# Patient Record
Sex: Male | Born: 1965
Health system: Southern US, Community
[De-identification: ages and names within clinical notes are randomized; demographics above are authoritative.]

## PROBLEM LIST (undated history)

## (undated) DIAGNOSIS — Z1331 Encounter for screening for depression: Secondary | ICD-10-CM

## (undated) DIAGNOSIS — R531 Weakness: Secondary | ICD-10-CM

## (undated) DIAGNOSIS — E785 Hyperlipidemia, unspecified: Secondary | ICD-10-CM

## (undated) DIAGNOSIS — Z6830 Body mass index (BMI) 30.0-30.9, adult: Secondary | ICD-10-CM

## (undated) DIAGNOSIS — I10 Essential (primary) hypertension: Secondary | ICD-10-CM

## (undated) HISTORY — PX: NO PAST SURGERIES: SHX2092

## (undated) HISTORY — DX: Encounter for screening for depression: Z13.31

## (undated) HISTORY — DX: Body mass index (BMI) 30.0-30.9, adult: Z68.30

## (undated) HISTORY — DX: Weakness: R53.1

## (undated) HISTORY — DX: Hyperlipidemia, unspecified: E78.5

## (undated) HISTORY — DX: Essential (primary) hypertension: I10

---

## 2018-07-28 DIAGNOSIS — H353121 Nonexudative age-related macular degeneration, left eye, early dry stage: Secondary | ICD-10-CM | POA: Diagnosis not present

## 2019-03-05 DIAGNOSIS — Z20828 Contact with and (suspected) exposure to other viral communicable diseases: Secondary | ICD-10-CM | POA: Diagnosis not present

## 2019-04-22 DIAGNOSIS — R05 Cough: Secondary | ICD-10-CM | POA: Diagnosis not present

## 2019-04-22 DIAGNOSIS — R509 Fever, unspecified: Secondary | ICD-10-CM | POA: Diagnosis not present

## 2019-04-22 DIAGNOSIS — R6883 Chills (without fever): Secondary | ICD-10-CM | POA: Diagnosis not present

## 2019-04-22 DIAGNOSIS — Z20828 Contact with and (suspected) exposure to other viral communicable diseases: Secondary | ICD-10-CM | POA: Diagnosis not present

## 2019-07-21 DIAGNOSIS — Z125 Encounter for screening for malignant neoplasm of prostate: Secondary | ICD-10-CM | POA: Diagnosis not present

## 2019-07-21 DIAGNOSIS — I10 Essential (primary) hypertension: Secondary | ICD-10-CM | POA: Diagnosis not present

## 2019-07-21 DIAGNOSIS — E785 Hyperlipidemia, unspecified: Secondary | ICD-10-CM | POA: Diagnosis not present

## 2019-07-21 DIAGNOSIS — Z1211 Encounter for screening for malignant neoplasm of colon: Secondary | ICD-10-CM | POA: Diagnosis not present

## 2019-08-26 DIAGNOSIS — Z1331 Encounter for screening for depression: Secondary | ICD-10-CM | POA: Diagnosis not present

## 2019-08-26 DIAGNOSIS — I10 Essential (primary) hypertension: Secondary | ICD-10-CM | POA: Diagnosis not present

## 2019-08-26 DIAGNOSIS — R55 Syncope and collapse: Secondary | ICD-10-CM | POA: Diagnosis not present

## 2019-08-26 DIAGNOSIS — E785 Hyperlipidemia, unspecified: Secondary | ICD-10-CM | POA: Diagnosis not present

## 2019-08-26 DIAGNOSIS — R531 Weakness: Secondary | ICD-10-CM | POA: Diagnosis not present

## 2019-08-26 DIAGNOSIS — E162 Hypoglycemia, unspecified: Secondary | ICD-10-CM | POA: Diagnosis not present

## 2019-09-01 ENCOUNTER — Other Ambulatory Visit: Payer: Self-pay

## 2019-09-01 ENCOUNTER — Encounter: Payer: Self-pay | Admitting: Cardiology

## 2019-09-01 ENCOUNTER — Ambulatory Visit (INDEPENDENT_AMBULATORY_CARE_PROVIDER_SITE_OTHER): Payer: BC Managed Care – PPO | Admitting: Cardiology

## 2019-09-01 DIAGNOSIS — I10 Essential (primary) hypertension: Secondary | ICD-10-CM

## 2019-09-01 DIAGNOSIS — E782 Mixed hyperlipidemia: Secondary | ICD-10-CM

## 2019-09-01 DIAGNOSIS — R011 Cardiac murmur, unspecified: Secondary | ICD-10-CM | POA: Diagnosis not present

## 2019-09-01 DIAGNOSIS — R0683 Snoring: Secondary | ICD-10-CM | POA: Diagnosis not present

## 2019-09-01 NOTE — Progress Notes (Signed)
Cardiology Office Note:    Date:  09/01/2019   ID:  Mark Mcclain, DOB 13-Jul-1965, MRN [id number]  PCP:  Marlyn Corporal, PA  Cardiologist:  Garwin Brothers, MD   Referring MD: Marlyn Corporal, PA    ASSESSMENT:    1. Essential hypertension   2. Mixed dyslipidemia   3. Cardiac murmur   4. Snoring    PLAN:    In order of problems listed above:  1. Primary prevention stressed with the patient.  Importance of compliance with diet and medication stressed and he vocalized understanding.  Again patient has no chest pain and I asked him to walk half an hour a day on a regular basis 2. Essential hypertension: His blood pressure is running low with the medications and I think this is causing him his hypotension.  He is having symptoms of orthostatic hypotension.  In addition the summer months might make this worse.  I have told him to stop his blood pressure medication.  His salt intake in the diet is very high and I educated him about this. 3. Mixed dyslipidemia: Diet was urged I reviewed his lipids with him at extensive length he will be back to see me in 6 weeks before which he will have complete blood work including lipids diet was heavily emphasized 4. For primary prevention in the stratification I suggested calcium scoring and he is agreeable. 5. Patient gives history of snoring.  His wife mentions to him that he sometimes stops breathing.  He would like to have a sleep study and we will order. 6. Further recommendations will be made based on the findings of the a forementioned test.   Medication Adjustments/Labs and Tests Ordered: Current medicines are reviewed at length with the patient today.  Concerns regarding medicines are outlined above.  Orders Placed This Encounter  Procedures  . CT CARDIAC SCORING  . Basic metabolic panel  . Hepatic function panel  . Lipid panel  . Ambulatory referral to Sleep Studies  . EKG 12-Lead  . ECHOCARDIOGRAM COMPLETE   No orders of the defined  types were placed in this encounter.    History of Present Illness:    Mark Mcclain is a 54 y.o. male who is being seen today for the evaluation of essential hypertension and mixed dyslipidemia at the request of Marlyn Corporal, [place].  Patient is a pleasant 54 year old male.  He has past medical history of essential hypertension and dyslipidemia.  He leads a sedentary lifestyle.  He denies any chest pain orthopnea or PND.  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  With this he has no chest pain.  No orthopnea or PND.  He mentions to me that when he is trying to change his posture he notices some lightheadedness.  This happens only on change of posture.  He is Dr. Has got down his blood pressure medicine but the symptoms have improved some but not completely.  At the time of my evaluation, the patient is alert awake oriented and in no distress.  No syncope or dizziness.  Past Medical History:  Diagnosis Date  . BMI 30.0-30.9,adult   . Hyperlipidemia   . Hypertension   . Screening for depression   . Weakness     Past Surgical History:  Procedure Laterality Date  . NO PAST SURGERIES      Current Medications: Current Meds  Medication Sig  . Cyanocobalamin (B-12) 2500 MCG TABS Take 2,500 mg by mouth daily.  Marland Kitchen  rosuvastatin (CRESTOR) 10 MG tablet Take 10 mg by mouth daily.  . Saccharomyces boulardii (PROBIOTIC) 250 MG CAPS Take 250 mg by mouth daily.  . [DISCONTINUED] losartan (COZAAR) 50 MG tablet Take 75 mg by mouth daily.     Allergies:   Zithromax [azithromycin]   Social History   Socioeconomic History  . Marital status: Unknown    Spouse name: Not on file  . Number of children: Not on file  . Years of education: Not on file  . Highest education level: Not on file  Occupational History  . Not on file  Tobacco Use  . Smoking status: Never Smoker  . Smokeless tobacco: Current User    Types: Chew  Substance and Sexual Activity  . Alcohol use: Not on file  .  Drug use: Not on file  . Sexual activity: Not on file  Other Topics Concern  . Not on file  Social History Narrative  . Not on file   Social Determinants of Health   Financial Resource Strain:   . Difficulty of Paying Living Expenses:   Food Insecurity:   . Worried About Charity fundraiser in the Last Year:   . Arboriculturist in the Last Year:   Transportation Needs:   . Film/video editor (Medical):   Marland Kitchen Lack of Transportation (Non-Medical):   Physical Activity:   . Days of Exercise per Week:   . Minutes of Exercise per Session:   Stress:   . Feeling of Stress :   Social Connections:   . Frequency of Communication with Friends and Family:   . Frequency of Social Gatherings with Friends and Family:   . Attends Religious Services:   . Active Member of Clubs or Organizations:   . Attends Archivist Meetings:   Marland Kitchen Marital Status:      Family History: The patient's family history is not on file.  ROS:   Please see the history of present illness.    All other systems reviewed and are negative.  EKGs/Labs/Other Studies Reviewed:    The following studies were reviewed today: EKG reveals sinus rhythm and nonspecific ST-T changes   Recent Labs: No results found for requested labs within last 8760 hours.  Recent Lipid Panel No results found for: CHOL, TRIG, HDL, CHOLHDL, VLDL, LDLCALC, LDLDIRECT  Physical Exam:    VS:  BP 126/82   Pulse (!) 52   Temp 97.6 F (36.4 C)   Ht 5\' 11"  (1.803 m)   Wt 215 lb 6.4 oz (97.7 kg)   SpO2 96%   BMI 30.04 kg/m     Wt Readings from Last 3 Encounters:  09/01/19 215 lb 6.4 oz (97.7 kg)     GEN: Patient is in no acute distress HEENT: Normal NECK: No JVD; No carotid bruits LYMPHATICS: No lymphadenopathy CARDIAC: S1 S2 regular, 2/6 systolic murmur at the apex. RESPIRATORY:  Clear to auscultation without rales, wheezing or rhonchi  ABDOMEN: Soft, non-tender, non-distended MUSCULOSKELETAL:  No edema; No deformity    SKIN: Warm and dry NEUROLOGIC:  Alert and oriented x 3 PSYCHIATRIC:  Normal affect    Signed, Jenean Lindau, MD  09/01/2019 11:43 AM    [place]

## 2019-09-01 NOTE — Patient Instructions (Signed)
Medication Instructions:  Your physician has recommended you make the following change in your medication:   Stop Losartan  *If you need a refill on your cardiac medications before your next appointment, please call your pharmacy*   Lab Work: Your physician recommends that you return for lab work in: before your next visit.  You need to have labs done when you are fasting.  You can come Monday through Friday 8:30 am to 12:00 pm and 1:15 to 4:30. You do not need to make an appointment as the order has already been placed. The labs you are going to have done are BMET,  LFT and Lipids.  If you have labs (blood work) drawn today and your tests are completely normal, you will receive your results only by: Marland Kitchen MyChart Message (if you have MyChart) OR . A paper copy in the mail If you have any lab test that is abnormal or we need to change your treatment, we will call you to review the results.   Testing/Procedures: Your physician has requested that you have an echocardiogram. Echocardiography is a painless test that uses sound waves to create images of your heart. It provides your doctor with information about the size and shape of your heart and how well your heart's chambers and valves are working. This procedure takes approximately one hour. There are no restrictions for this procedure.   We will order CT coronary calcium score $150  Please call [phone] to schedule   [facility]  [address], [place] [address]     Follow-Up: At [facility], you and your health needs are our priority.  As part of our continuing mission to provide you with exceptional heart care, we have created designated Provider Care Teams.  These Care Teams include your primary Cardiologist (physician) and Advanced Practice Providers (APPs -  Physician Assistants and Nurse Practitioners) who all work together to provide you with the care you need, when you need it.  We recommend  signing up for the patient portal called "MyChart".  Sign up information is provided on this After Visit Summary.  MyChart is used to connect with patients for Virtual Visits (Telemedicine).  Patients are able to view lab/test results, encounter notes, upcoming appointments, etc.  Non-urgent messages can be sent to your provider as well.   To learn more about what you can do with MyChart, go to [email or web address].    Your next appointment:   6 month(s)  The format for your next appointment:   In Person  Provider:   Jyl Heinz, MD   Other Instructions  Echocardiogram An echocardiogram is a procedure that uses painless sound waves (ultrasound) to produce an image of the heart. Images from an echocardiogram can provide important information about:  Signs of coronary artery disease (CAD).  Aneurysm detection. An aneurysm is a weak or damaged part of an artery wall that bulges out from the normal force of blood pumping through the body.  Heart size and shape. Changes in the size or shape of the heart can be associated with certain conditions, including heart failure, aneurysm, and CAD.  Heart muscle function.  Heart valve function.  Signs of a past heart attack.  Fluid buildup around the heart.  Thickening of the heart muscle.  A tumor or infectious growth around the heart valves. Tell a health care provider about:  Any allergies you have.  All medicines you are taking, including vitamins, herbs, eye drops, creams, and over-the-counter  medicines.  Any blood disorders you have.  Any surgeries you have had.  Any medical conditions you have.  Whether you are pregnant or may be pregnant. What are the risks? Generally, this is a safe procedure. However, problems may occur, including:  Allergic reaction to dye (contrast) that may be used during the procedure. What happens before the procedure? No specific preparation is needed. You may eat and drink normally. What  happens during the procedure?   An IV tube may be inserted into one of your veins.  You may receive contrast through this tube. A contrast is an injection that improves the quality of the pictures from your heart.  A gel will be applied to your chest.  A wand-like tool (transducer) will be moved over your chest. The gel will help to transmit the sound waves from the transducer.  The sound waves will harmlessly bounce off of your heart to allow the heart images to be captured in real-time motion. The images will be recorded on a computer. The procedure may vary among health care providers and hospitals. What happens after the procedure?  You may return to your normal, everyday life, including diet, activities, and medicines, unless your health care provider tells you not to do that. Summary  An echocardiogram is a procedure that uses painless sound waves (ultrasound) to produce an image of the heart.  Images from an echocardiogram can provide important information about the size and shape of your heart, heart muscle function, heart valve function, and fluid buildup around your heart.  You do not need to do anything to prepare before this procedure. You may eat and drink normally.  After the echocardiogram is completed, you may return to your normal, everyday life, unless your health care provider tells you not to do that. This information is not intended to replace advice given to you by your health care provider. Make sure you discuss any questions you have with your health care provider. Document Revised: 08/14/2018 Document Reviewed: 05/26/2016 Elsevier Patient Education  2020 [employer].

## 2019-09-09 ENCOUNTER — Ambulatory Visit (INDEPENDENT_AMBULATORY_CARE_PROVIDER_SITE_OTHER)
Admission: RE | Admit: 2019-09-09 | Discharge: 2019-09-09 | Disposition: A | Discharge status: Home / Self Care | Payer: Self-pay | Source: Ambulatory Visit | Attending: Cardiology | Admitting: Cardiology

## 2019-09-09 ENCOUNTER — Other Ambulatory Visit: Payer: Self-pay

## 2019-09-09 DIAGNOSIS — E782 Mixed hyperlipidemia: Secondary | ICD-10-CM

## 2019-09-09 DIAGNOSIS — I10 Essential (primary) hypertension: Secondary | ICD-10-CM

## 2019-09-18 ENCOUNTER — Other Ambulatory Visit: Payer: BC Managed Care – PPO

## 2019-09-22 ENCOUNTER — Ambulatory Visit (INDEPENDENT_AMBULATORY_CARE_PROVIDER_SITE_OTHER): Payer: BC Managed Care – PPO

## 2019-09-22 ENCOUNTER — Other Ambulatory Visit: Payer: Self-pay

## 2019-09-22 DIAGNOSIS — E782 Mixed hyperlipidemia: Secondary | ICD-10-CM | POA: Diagnosis not present

## 2019-09-22 DIAGNOSIS — I10 Essential (primary) hypertension: Secondary | ICD-10-CM | POA: Diagnosis not present

## 2019-09-22 DIAGNOSIS — R011 Cardiac murmur, unspecified: Secondary | ICD-10-CM

## 2019-09-22 LAB — LIPID PANEL
Chol/HDL Ratio: 5.6 ratio — ABNORMAL HIGH (ref 0.0–5.0)
Cholesterol, Total: 156 mg/dL (ref 100–199)
HDL: 28 mg/dL — ABNORMAL LOW (ref >39)
LDL Chol Calc (NIH): 85 mg/dL (ref 0–99)
Triglycerides: 256 mg/dL — ABNORMAL HIGH (ref 0–149)
VLDL Cholesterol Cal: 43 mg/dL — ABNORMAL HIGH (ref 5–40)

## 2019-09-22 LAB — BASIC METABOLIC PANEL
BUN/Creatinine Ratio: 17 (ref 9–20)
BUN: 20 mg/dL (ref 6–24)
CO2: 24 mmol/L (ref 20–29)
Calcium: 9.1 mg/dL (ref 8.7–10.2)
Chloride: 105 mmol/L (ref 96–106)
Creatinine, Ser: 1.17 mg/dL (ref 0.76–1.27)
GFR calc Af Amer: 81 mL/min/{1.73_m2} (ref >59)
GFR calc non Af Amer: 70 mL/min/{1.73_m2} (ref >59)
Glucose: 93 mg/dL (ref 65–99)
Potassium: 4.1 mmol/L (ref 3.5–5.2)
Sodium: 141 mmol/L (ref 134–144)

## 2019-09-22 LAB — HEPATIC FUNCTION PANEL
ALT: 24 IU/L (ref 0–44)
AST: 23 IU/L (ref 0–40)
Albumin: 4.3 g/dL (ref 3.8–4.9)
Alkaline Phosphatase: 74 IU/L (ref 48–121)
Bilirubin Total: 0.6 mg/dL (ref 0.0–1.2)
Bilirubin, Direct: 0.18 mg/dL (ref 0.00–0.40)
Total Protein: 6.6 g/dL (ref 6.0–8.5)

## 2019-09-30 DIAGNOSIS — R42 Dizziness and giddiness: Secondary | ICD-10-CM | POA: Diagnosis not present

## 2019-09-30 DIAGNOSIS — I10 Essential (primary) hypertension: Secondary | ICD-10-CM | POA: Diagnosis not present

## 2019-09-30 DIAGNOSIS — E785 Hyperlipidemia, unspecified: Secondary | ICD-10-CM | POA: Diagnosis not present

## 2019-11-11 DIAGNOSIS — Z1211 Encounter for screening for malignant neoplasm of colon: Secondary | ICD-10-CM | POA: Diagnosis not present

## 2019-11-28 DIAGNOSIS — R509 Fever, unspecified: Secondary | ICD-10-CM | POA: Diagnosis not present

## 2019-11-28 DIAGNOSIS — Z20828 Contact with and (suspected) exposure to other viral communicable diseases: Secondary | ICD-10-CM | POA: Diagnosis not present

## 2019-12-01 ENCOUNTER — Telehealth: Payer: Self-pay

## 2019-12-01 DIAGNOSIS — R0601 Orthopnea: Secondary | ICD-10-CM

## 2019-12-01 DIAGNOSIS — Z87898 Personal history of other specified conditions: Secondary | ICD-10-CM

## 2019-12-01 NOTE — Telephone Encounter (Signed)
Per Dr. Tomie China pt requests a sleep study. Order is in and sent to [facility]

## 2019-12-25 ENCOUNTER — Telehealth: Payer: Self-pay | Admitting: *Deleted

## 2019-12-25 ENCOUNTER — Telehealth: Payer: Self-pay | Admitting: Cardiology

## 2019-12-25 NOTE — Telephone Encounter (Signed)
With the diagnosis split night does not meet criteria for approval.  Doctor can do a peer to peer by calling [phone]

## 2019-12-25 NOTE — Telephone Encounter (Signed)
Pre cert sent to [facility] to complete.

## 2019-12-29 ENCOUNTER — Telehealth: Payer: Self-pay | Admitting: *Deleted

## 2019-12-29 NOTE — Telephone Encounter (Signed)
-----   Message from Garwin Brothers, MD sent at 12/29/2019  8:59 AM EDT ----- Regarding: RE: split night Please refer patient to sleep study specialist.  Sleep apnea specialist. ----- Message ----- From: Reesa Chew, CMA Sent: 12/28/2019   5:30 PM EDT To: Garwin Brothers, MD Subject: FW: split night                                In lab denied provider or nurse can do a peer to peer. Thank you ----- Message ----- From: Gaynelle Cage, CMA Sent: 12/25/2019  12:26 PM EDT To: Reesa Chew, CMA, Rebbeca Paul, CMA Subject: RE: split night                                Please notify ordering provider needs to call and do a peer to peer at the number listed in Shannon's message. ----- Message ----- From: Pollyann Kennedy Sent: 12/25/2019  10:48 AM EDT To: Gaynelle Cage, CMA Subject: RE: split night                                With the diagnosis it does not meet criteria for approval.  Doctor can do a peer to peer by calling [phone].  Also be aware that this plan, [facility], did not have [employer] listed with the normal address.  It had to be added for this case only.  They wouldn't tell me what they have. ----- Message ----- From: Gaynelle Cage, CMA Sent: 12/25/2019   9:48 AM EDT To: Pollyann Kennedy Subject: RE: split night                                 ----- Message ----- From: Rebbeca Paul, CMA Sent: 12/22/2019  12:06 PM EDT To: Gaynelle Cage, CMA, Cv Div Sleep Studies Subject: split night                                    Hello,  A message was sent on 12/01/19 to have this patient split night precerted. I do not see any notes that say whether the study was approved or denied. Can you please check on the status.  Thanks

## 2019-12-29 NOTE — Telephone Encounter (Signed)
Patient needs an appointment for his sleep referral sent from Dr Josiah Lobo.

## 2019-12-29 NOTE — Telephone Encounter (Signed)
RE: APAP SETTINGS Quintella Reichert, MD  Reesa Chew, CMA; Gaynelle Cage, CMA I have never seen this patient and I did not order sleep study. This needs to go to ordering provider to handle   Traci

## 2020-01-03 NOTE — Progress Notes (Signed)
Virtual Visit via Telephone Note   This visit type was conducted due to national recommendations for restrictions regarding the COVID-19 Pandemic (e.g. social distancing) in an effort to limit this patient's exposure and mitigate transmission in our community.  Due to his co-morbid illnesses, this patient is at least at moderate risk for complications without adequate follow up.  This format is felt to be most appropriate for this patient at this time.  The patient did not have access to video technology/had technical difficulties with video requiring transitioning to audio format only (telephone).  All issues noted in this document were discussed and addressed.  No physical exam could be performed with this format.  Please refer to the patient's chart for his  consent to telehealth for [facility].   Evaluation Performed:  Follow-up visit  This visit type was conducted due to national recommendations for restrictions regarding the COVID-19 Pandemic (e.g. social distancing).  This format is felt to be most appropriate for this patient at this time.  All issues noted in this document were discussed and addressed.  No physical exam was performed (except for noted visual exam findings with Video Visits).  Please refer to the patient's chart (MyChart message for video visits and phone note for telephone visits) for the patient's consent to telehealth for [facility].  Date:  01/04/2020   ID:  Mark Mcclain, DOB [birth year], MRN [id number]  Patient Location:  Home  Provider location:   [facility]  PCP:  Marlyn Corporal, PA  Cardiologist:  Belva Crome, MD Electrophysiologist:  None   Chief Complaint:  OSA  History of Present Illness:    Mark Mcclain is a 54 y.o. male who presents via audio/video conferencing for a telehealth visit today.    This is a 54yo male with a hx of HTN, snoring and witnessed apnea. Apparently a sleep study was ordered but insurance would not cover this so he is  now referred for evaluation.  He tells me that his wife tells him that he snores a lot at night and he has awakened with gasping for breath at night occasionally.  He feels sleepy in the morning and feels sleepy throughout the day but has not work during the day.  When he gets home at night and sits in his recliner he will fall asleep quickly.  He denies any am headaches.  He sleeps mainly on his back. He has some problems occasionally with sinus congestion and will use nasal saline and steroid spray PRN.  The patient does not have symptoms concerning for COVID-19 infection (fever, chills, cough, or new shortness of breath).    Prior CV studies:   The following studies were reviewed today:  none  Past Medical History:  Diagnosis Date  . BMI 30.0-30.9,adult   . Hyperlipidemia   . Hypertension   . Screening for depression   . Weakness    Past Surgical History:  Procedure Laterality Date  . NO PAST SURGERIES       Current Meds  Medication Sig  . Cyanocobalamin (B-12) 2500 MCG TABS Take 2,500 mg by mouth daily.  Marland Kitchen losartan (COZAAR) 50 MG tablet Take 50 mg by mouth daily.  . rosuvastatin (CRESTOR) 10 MG tablet Take 10 mg by mouth daily.  . Saccharomyces boulardii (PROBIOTIC) 250 MG CAPS Take 250 mg by mouth daily.     Allergies:   Zithromax [azithromycin]   Social History   Tobacco Use  . Smoking status: Never Smoker  . Smokeless tobacco:  Current User    Types: Chew  Substance Use Topics  . Alcohol use: Not on file  . Drug use: Not on file     Family Hx: The patient's family history is not on file.  ROS:   Please see the history of present illness.     All other systems reviewed and are negative.   Labs/Other Tests and Data Reviewed:    Recent Labs: 09/22/2019: ALT 24; BUN 20; Creatinine, Ser 1.17; Potassium 4.1; Sodium 141   Recent Lipid Panel Lab Results  Component Value Date/Time   CHOL 156 09/22/2019 08:19 AM   TRIG 256 (H) 09/22/2019 08:19 AM   HDL 28 (L)  09/22/2019 08:19 AM   CHOLHDL 5.6 (H) 09/22/2019 08:19 AM   LDLCALC 85 09/22/2019 08:19 AM    Wt Readings from Last 3 Encounters:  01/04/20 210 lb (95.3 kg)  09/01/19 215 lb 6.4 oz (97.7 kg)     Objective:    Vital Signs:  Ht 5\' 11"  (1.803 m)   Wt 210 lb (95.3 kg)   BMI 29.29 kg/m      ASSESSMENT & PLAN:    1.  Snoring and Witnessed apnea during sleep -he clearly has symptoms of excessive daytime sleepiness, snoring and awakening gasping for air and suspect he has significant sleep apnea -apparently his insurance refused in lab study so will order a home sleep study  2.  HTN -diet controlled  COVID-19 Education: The signs and symptoms of COVID-19 were discussed with the patient and how to seek care for testing (follow up with PCP or arrange E-visit).  The importance of social distancing was discussed today.  Patient Risk:   After full review of this patient's clinical status, I feel that they are at least moderate risk at this time.  Time:   Today, I have spent 15 minutes on telemedicine discussing medical problems including snoring and witnessed apnea and reviewing patient's chart including OV notes from Cardiology.  Medication Adjustments/Labs and Tests Ordered: Current medicines are reviewed at length with the patient today.  Concerns regarding medicines are outlined above.  Tests Ordered: No orders of the defined types were placed in this encounter.  Medication Changes: No orders of the defined types were placed in this encounter.   Disposition:  Follow up prn after sleep study  Signed, , MD  01/04/2020 9:12 AM    [facility]

## 2020-01-04 ENCOUNTER — Telehealth: Payer: Self-pay | Admitting: *Deleted

## 2020-01-04 ENCOUNTER — Other Ambulatory Visit: Payer: Self-pay

## 2020-01-04 ENCOUNTER — Telehealth (INDEPENDENT_AMBULATORY_CARE_PROVIDER_SITE_OTHER): Payer: BC Managed Care – PPO | Admitting: Cardiology

## 2020-01-04 ENCOUNTER — Encounter: Payer: Self-pay | Admitting: Cardiology

## 2020-01-04 VITALS — Ht 71.0 in | Wt 210.0 lb

## 2020-01-04 DIAGNOSIS — Z87898 Personal history of other specified conditions: Secondary | ICD-10-CM

## 2020-01-04 DIAGNOSIS — R0681 Apnea, not elsewhere classified: Secondary | ICD-10-CM | POA: Diagnosis not present

## 2020-01-04 DIAGNOSIS — I10 Essential (primary) hypertension: Secondary | ICD-10-CM

## 2020-01-04 DIAGNOSIS — R0683 Snoring: Secondary | ICD-10-CM

## 2020-01-04 NOTE — Addendum Note (Signed)
Addended by: Dossie Arbour on: 01/04/2020 09:39 AM   Modules accepted: Orders

## 2020-01-04 NOTE — Telephone Encounter (Signed)
-----   Message from Dossie Arbour, RN sent at 01/04/2020  9:39 AM EDT ----- Regarding: Home sleep study Dr Mayford Knife has ordered a home sleep study for this patient. Thanks, Dennie Bible

## 2020-01-04 NOTE — Patient Instructions (Signed)
Medication Instructions:  Your physician recommends that you continue on your current medications as directed. Please refer to the Current Medication list given to you today.  *If you need a refill on your cardiac medications before your next appointment, please call your pharmacy*   Lab Work: none If you have labs (blood work) drawn today and your tests are completely normal, you will receive your results only by: Marland Kitchen MyChart Message (if you have MyChart) OR . A paper copy in the mail If you have any lab test that is abnormal or we need to change your treatment, we will call you to review the results.   Testing/Procedures: Your physician has recommended that you have a sleep study. This test records several body functions during sleep, including: brain activity, eye movement, oxygen and carbon dioxide blood levels, heart rate and rhythm, breathing rate and rhythm, the flow of air through your mouth and nose, snoring, body muscle movements, and chest and belly movement.     Follow-Up: At [facility], you and your health needs are our priority.  As part of our continuing mission to provide you with exceptional heart care, we have created designated Provider Care Teams.  These Care Teams include your primary Cardiologist (physician) and Advanced Practice Providers (APPs -  Physician Assistants and Nurse Practitioners) who all work together to provide you with the care you need, when you need it.  We recommend signing up for the patient portal called "MyChart".  Sign up information is provided on this After Visit Summary.  MyChart is used to connect with patients for Virtual Visits (Telemedicine).  Patients are able to view lab/test results, encounter notes, upcoming appointments, etc.  Non-urgent messages can be sent to your provider as well.   To learn more about what you can do with MyChart, go to [email or web address].    Your next appointment:   As needed with Dr Mayford Knife       Provider:   Armanda Magic, MD   Other Instructions

## 2020-01-04 NOTE — Telephone Encounter (Signed)
  Patient Consent for Virtual Visit         Mark Mcclain has provided verbal consent on 01/04/2020 for a virtual visit (video or telephone).   CONSENT FOR VIRTUAL VISIT FOR:  Mark Mcclain  By participating in this virtual visit I agree to the following:  I hereby voluntarily request, consent and authorize [facility] and its employed or contracted physicians, physician assistants, nurse practitioners or other licensed health care professionals (the Practitioner), to provide me with telemedicine health care services (the "Services") as deemed necessary by the treating Practitioner. I acknowledge and consent to receive the Services by the Practitioner via telemedicine. I understand that the telemedicine visit will involve communicating with the Practitioner through live audiovisual communication technology and the disclosure of certain medical information by electronic transmission. I acknowledge that I have been given the opportunity to request an in-person assessment or other available alternative prior to the telemedicine visit and am voluntarily participating in the telemedicine visit.  I understand that I have the right to withhold or withdraw my consent to the use of telemedicine in the course of my care at any time, without affecting my right to future care or treatment, and that the Practitioner or I may terminate the telemedicine visit at any time. I understand that I have the right to inspect all information obtained and/or recorded in the course of the telemedicine visit and may receive copies of available information for a reasonable fee.  I understand that some of the potential risks of receiving the Services via telemedicine include:  Marland Kitchen Delay or interruption in medical evaluation due to technological equipment failure or disruption; . Information transmitted may not be sufficient (e.g. poor resolution of images) to allow for appropriate medical decision making by the Practitioner;  and/or  . In rare instances, security protocols could fail, causing a breach of personal health information.  Furthermore, I acknowledge that it is my responsibility to provide information about my medical history, conditions and care that is complete and accurate to the best of my ability. I acknowledge that Practitioner's advice, recommendations, and/or decision may be based on factors not within their control, such as incomplete or inaccurate data provided by me or distortions of diagnostic images or specimens that may result from electronic transmissions. I understand that the practice of medicine is not an exact science and that Practitioner makes no warranties or guarantees regarding treatment outcomes. I acknowledge that a copy of this consent can be made available to me via my patient portal [facility] MyChart), or I can request a printed copy by calling the office of [facility].    I understand that my insurance will be billed for this visit.   I have read or had this consent read to me. . I understand the contents of this consent, which adequately explains the benefits and risks of the Services being provided via telemedicine.  . I have been provided ample opportunity to ask questions regarding this consent and the Services and have had my questions answered to my satisfaction. . I give my informed consent for the services to be provided through the use of telemedicine in my medical care

## 2020-01-04 NOTE — Telephone Encounter (Signed)
Placed call to patient to get consent prior to today's visit.  Left message that I would call again in a few minutes

## 2020-01-05 NOTE — Telephone Encounter (Addendum)
[facility] Approved Home Sleep Duffy Bruce # - [id number] Auth dates - 01/05/20 - 03/04/20 Order placed  Sent to Veda Canning, CMA to schedule

## 2020-01-05 NOTE — Addendum Note (Signed)
Addended by: Dareen Piano on: 01/05/2020 03:27 PM   Modules accepted: Orders

## 2020-01-08 NOTE — Telephone Encounter (Signed)
Patient is aware and agreeable to Home Sleep Study through [facility]. Patient is scheduled for 02/19/20 at 10 am to pick up home sleep kit and meet with Respiratory therapist at [facility]. Patient is aware that if this appointment date and time does not work for them they should contact Artis Delay directly at [phone].  Left detailed message on voicemail with date and time of titration and informed patient to call back to confirm or reschedule.

## 2020-01-21 DIAGNOSIS — Z20828 Contact with and (suspected) exposure to other viral communicable diseases: Secondary | ICD-10-CM | POA: Diagnosis not present

## 2020-01-21 DIAGNOSIS — J069 Acute upper respiratory infection, unspecified: Secondary | ICD-10-CM | POA: Diagnosis not present

## 2020-02-17 ENCOUNTER — Ambulatory Visit (HOSPITAL_BASED_OUTPATIENT_CLINIC_OR_DEPARTMENT_OTHER): Payer: BC Managed Care – PPO

## 2020-02-17 ENCOUNTER — Other Ambulatory Visit: Payer: Self-pay

## 2020-02-17 DIAGNOSIS — R0683 Snoring: Secondary | ICD-10-CM

## 2020-02-17 DIAGNOSIS — R0681 Apnea, not elsewhere classified: Secondary | ICD-10-CM

## 2020-02-19 ENCOUNTER — Encounter (HOSPITAL_BASED_OUTPATIENT_CLINIC_OR_DEPARTMENT_OTHER): Payer: BC Managed Care – PPO | Admitting: Cardiology

## 2020-02-23 ENCOUNTER — Other Ambulatory Visit: Payer: Self-pay

## 2020-02-23 ENCOUNTER — Ambulatory Visit (HOSPITAL_BASED_OUTPATIENT_CLINIC_OR_DEPARTMENT_OTHER): Payer: BC Managed Care – PPO | Attending: Cardiology | Admitting: Cardiology

## 2020-02-23 VITALS — Ht 70.0 in | Wt 215.0 lb

## 2020-02-23 DIAGNOSIS — G4733 Obstructive sleep apnea (adult) (pediatric): Secondary | ICD-10-CM | POA: Diagnosis not present

## 2020-02-23 DIAGNOSIS — R0683 Snoring: Secondary | ICD-10-CM | POA: Diagnosis not present

## 2020-02-23 DIAGNOSIS — R0902 Hypoxemia: Secondary | ICD-10-CM | POA: Insufficient documentation

## 2020-02-24 DIAGNOSIS — I10 Essential (primary) hypertension: Secondary | ICD-10-CM | POA: Diagnosis not present

## 2020-02-24 DIAGNOSIS — R42 Dizziness and giddiness: Secondary | ICD-10-CM | POA: Diagnosis not present

## 2020-02-24 DIAGNOSIS — E785 Hyperlipidemia, unspecified: Secondary | ICD-10-CM | POA: Diagnosis not present

## 2020-02-24 DIAGNOSIS — Z23 Encounter for immunization: Secondary | ICD-10-CM | POA: Diagnosis not present

## 2020-02-24 DIAGNOSIS — Z6830 Body mass index (BMI) 30.0-30.9, adult: Secondary | ICD-10-CM | POA: Diagnosis not present

## 2020-02-25 ENCOUNTER — Telehealth: Payer: Self-pay

## 2020-02-25 NOTE — Procedures (Signed)
   Patient Name: Mark Mcclain, Mark Mcclain Date: 02/23/2020 Gender: Male D.O.B: [birth year] Age (years): 63 Referring Provider: Armanda Magic MD, ABSM Height (inches): 70 Interpreting Physician: Armanda Magic MD, ABSM Weight (lbs): 215 RPSGT: [place] BMI: 31 MRN: [id number] Neck Size: 17.00  CLINICAL INFORMATION Sleep Study Type: HST  Indication for sleep study: N/A  Epworth Sleepiness Score: 8  SLEEP STUDY TECHNIQUE A multi-channel overnight portable sleep study was performed. The channels recorded were: nasal airflow, thoracic respiratory movement, and oxygen saturation with a pulse oximetry. Snoring was also monitored.  MEDICATIONS Patient self administered medications include: N/A.  SLEEP ARCHITECTURE Patient was studied for 493.5 minutes. The sleep efficiency was 100.0 % and the patient was supine for 92.7%. The arousal index was 0.0 per hour.  RESPIRATORY PARAMETERS The overall AHI was 37.8 per hour, with a central apnea index of 0.0 per hour.  The oxygen nadir was 82% during sleep.  CARDIAC DATA Mean heart rate during sleep was 57.5 bpm.  IMPRESSIONS - Severe obstructive sleep apnea occurred during this study (AHI = 37.8/h). - No significant central sleep apnea occurred during this study (CAI = 0.0/h). - Moderate oxygen desaturation was noted during this study (Min O2 = 82%). - Patient snored 2.3% during the sleep.  DIAGNOSIS - Obstructive Sleep Apnea (G47.33) - Nocturnal Hypoxemia (G47.36)  RECOMMENDATIONS - Recommend ResMed auto CPAP from 4 to 20cm H2O with heated humidity and mask of choice. - Avoid alcohol, sedatives and other CNS depressants that may worsen sleep apnea and disrupt normal sleep architecture. - Sleep hygiene should be reviewed to assess factors that may improve sleep quality. - Weight management and regular exercise should be initiated or continued. - [facility]. - Recommend overnight pulse ox on  CPAP.  [Electronically signed] 02/25/2020 08:23 AM  Armanda Magic MD, ABSM Diplomate, American Board of Sleep Medicine

## 2020-02-25 NOTE — Telephone Encounter (Signed)
Informed patient of sleep study results and patient understanding was verbalized. Patient understands her sleep study showed  - Severe obstructive sleep apnea occurred during this study (AHI = 37.8/h). - No significant central sleep apnea occurred during this study (CAI = 0.0/h). - Moderate oxygen desaturation was noted during this study (Min O2 = 82%). - Patient snored 2.3% during the sleep. Upon patient request DME selection is [facility] in [place]. Patient understands he will be contacted by [facility] to set up his cpap. Patient understands to call if [facility] does not contact him with new setup in a timely manner. Patient understands they will be called once confirmation has been received from [facility] Patient that they have received their new machine to schedule 8 week follow up appointment.   [facility] notified of new cpap order  Please add to airview Patient was grateful for the call and thanked me.

## 2020-02-25 NOTE — Telephone Encounter (Signed)
-----   Message from Quintella Reichert, MD sent at 02/25/2020  8:26 AM EDT ----- Please let patient know that they have sleep apnea and recommend auto CPAP titration through DME  Orders have been placed in Epic. Please set 8 week OV with me.

## 2020-03-16 NOTE — Telephone Encounter (Signed)
I have reached out to [facility] Patient and was informed by [facility] that the order is awaiting insurance approval and a machine and she will call and notify the patient.

## 2020-03-16 NOTE — Telephone Encounter (Signed)
Patient states that [facility] in [place] has not received his cpap order. He was advised to reach back out to [place].

## 2020-04-13 ENCOUNTER — Telehealth: Payer: Self-pay | Admitting: Cardiology

## 2020-04-13 NOTE — Telephone Encounter (Signed)
Patient said he never got his CPAP Machine. He called his insurance company to see what he needed to do to expedite the process and he was informed that the location the machine was initially ordered from was out of stock. He hasn't heard anything about ordering a machine from another location.He wanted to know if there was anything he needed to do to get his CPAP Machine ordered. Please assist

## 2020-04-15 NOTE — Telephone Encounter (Signed)
Reached out to patient and explained there is a Therapist, art on cpap machines because of a recall. There is nothing for him to so but wait to be contacted when one comes in for him.  Left detailed message on voicemail and informed patient to call back with questions.

## 2020-05-17 DIAGNOSIS — G4733 Obstructive sleep apnea (adult) (pediatric): Secondary | ICD-10-CM | POA: Diagnosis not present

## 2020-06-17 DIAGNOSIS — G4733 Obstructive sleep apnea (adult) (pediatric): Secondary | ICD-10-CM | POA: Diagnosis not present

## 2020-07-06 DIAGNOSIS — E785 Hyperlipidemia, unspecified: Secondary | ICD-10-CM | POA: Diagnosis not present

## 2020-07-06 DIAGNOSIS — Z6830 Body mass index (BMI) 30.0-30.9, adult: Secondary | ICD-10-CM | POA: Diagnosis not present

## 2020-07-06 DIAGNOSIS — R5383 Other fatigue: Secondary | ICD-10-CM | POA: Diagnosis not present

## 2020-07-06 DIAGNOSIS — I10 Essential (primary) hypertension: Secondary | ICD-10-CM | POA: Diagnosis not present

## 2020-07-15 ENCOUNTER — Telehealth (INDEPENDENT_AMBULATORY_CARE_PROVIDER_SITE_OTHER): Payer: BC Managed Care – PPO | Admitting: Cardiology

## 2020-07-15 ENCOUNTER — Encounter: Payer: Self-pay | Admitting: Cardiology

## 2020-07-15 ENCOUNTER — Telehealth: Payer: Self-pay | Admitting: *Deleted

## 2020-07-15 VITALS — BP 148/80 | Ht 71.0 in | Wt 216.0 lb

## 2020-07-15 DIAGNOSIS — G4733 Obstructive sleep apnea (adult) (pediatric): Secondary | ICD-10-CM | POA: Diagnosis not present

## 2020-07-15 DIAGNOSIS — I10 Essential (primary) hypertension: Secondary | ICD-10-CM

## 2020-07-15 DIAGNOSIS — E669 Obesity, unspecified: Secondary | ICD-10-CM | POA: Diagnosis not present

## 2020-07-15 NOTE — Patient Instructions (Signed)
Medication Instructions:  Your physician recommends that you continue on your current medications as directed. Please refer to the Current Medication list given to you today.  *If you need a refill on your cardiac medications before your next appointment, please call your pharmacy*   Lab Work: none If you have labs (blood work) drawn today and your tests are completely normal, you will receive your results only by: Marland Kitchen MyChart Message (if you have MyChart) OR . A paper copy in the mail If you have any lab test that is abnormal or we need to change your treatment, we will call you to review the results.   Testing/Procedures: none   Follow-Up: At [facility], you and your health needs are our priority.  As part of our continuing mission to provide you with exceptional heart care, we have created designated Provider Care Teams.  These Care Teams include your primary Cardiologist (physician) and Advanced Practice Providers (APPs -  Physician Assistants and Nurse Practitioners) who all work together to provide you with the care you need, when you need it.  We recommend signing up for the patient portal called "MyChart".  Sign up information is provided on this After Visit Summary.  MyChart is used to connect with patients for Virtual Visits (Telemedicine).  Patients are able to view lab/test results, encounter notes, upcoming appointments, etc.  Non-urgent messages can be sent to your provider as well.   To learn more about what you can do with MyChart, go to [email or web address].    Your next appointment:   June 15,2022 at 9:40  The format for your next appointment:   Virtual Visit   Provider:   Armanda Magic, MD   Other Instructions

## 2020-07-15 NOTE — Progress Notes (Signed)
Virtual Visit via Video  Note   This visit type was conducted due to national recommendations for restrictions regarding the COVID-19 Pandemic (e.g. social distancing) in an effort to limit this patient's exposure and mitigate transmission in our community.  Due to his co-morbid illnesses, this patient is at least at moderate risk for complications without adequate follow up.  This format is felt to be most appropriate for this patient at this time.  The patient did not have access to video technology/had technical difficulties with video requiring transitioning to audio format only (telephone).  All issues noted in this document were discussed and addressed.  No physical exam could be performed with this format.  Please refer to the patient's chart for his  consent to telehealth for [facility].   Evaluation Performed:  Follow-up visit  This visit type was conducted due to national recommendations for restrictions regarding the COVID-19 Pandemic (e.g. social distancing).  This format is felt to be most appropriate for this patient at this time.  All issues noted in this document were discussed and addressed.  No physical exam was performed (except for noted visual exam findings with Video Visits).  Please refer to the patient's chart (MyChart message for video visits and phone note for telephone visits) for the patient's consent to telehealth for [facility].  Date:  07/15/2020   ID:  Mark Mcclain, DOB [birth year], MRN [id number]  Patient Location:  Home  Provider location:   [facility]  PCP:  Marlyn Corporal, PA  Cardiologist:  Belva Crome, MD Electrophysiologist:  None   Chief Complaint:  OSA  History of Present Illness:    Mark Mcclain is a 55 y.o. male who presents via audio/video conferencing for a telehealth visit today.    This is a 55yo male with a hx of HTN, snoring and witnessed apnea. He was referred by Dr. Tomie China for evaluation of excessive daytime sleepiness.  He  underwent a home sleep study which showed severe OSA with an AHI of 37.8/hr with no central apneas and nocturnal hypoxemia with O2 sats as low as 82%.  He was placed on auto CPAP from 4 to 20cm H2O and his now here for followup.   He is doing well with his CPAP device and thinks that he has gotten used to it.  He tolerates the nasal mask and feels the pressure is adequate.  Since going on CPAP he feels rested in the am and has no significant daytime sleepiness if he slept well the night before.  He has problems with mouth dryness at night but has not been using a chin strap.  He has chronic clear nasal drainage as well.   He does not think that he snores with the mask on.    The patient does not have symptoms concerning for COVID-19 infection (fever, chills, cough, or new shortness of breath).    Prior sleep  studies:   The following studies were reviewed today:  Home sleep study, PAP compliance download  Past Medical History:  Diagnosis Date  . BMI 30.0-30.9,adult   . Hyperlipidemia   . Hypertension   . Screening for depression   . Weakness    Past Surgical History:  Procedure Laterality Date  . NO PAST SURGERIES       Current Meds  Medication Sig  . losartan (COZAAR) 50 MG tablet Take 25 mg by mouth daily.  . Multiple Vitamin (MULTIVITAMIN) tablet Take 1 tablet by mouth daily.  . rosuvastatin (CRESTOR) 10  MG tablet Take 10 mg by mouth daily.  . Saccharomyces boulardii (PROBIOTIC) 250 MG CAPS Take 250 mg by mouth daily.     Allergies:   Zithromax [azithromycin]   Social History   Tobacco Use  . Smoking status: Never Smoker  . Smokeless tobacco: Current User    Types: Chew     Family Hx: The patient's family history is not on file.  ROS:   Please see the history of present illness.     All other systems reviewed and are negative.   Labs/Other Tests and Data Reviewed:    Recent Labs: 09/22/2019: ALT 24; BUN 20; Creatinine, Ser 1.17; Potassium 4.1; Sodium 141    Recent Lipid Panel Lab Results  Component Value Date/Time   CHOL 156 09/22/2019 08:19 AM   TRIG 256 (H) 09/22/2019 08:19 AM   HDL 28 (L) 09/22/2019 08:19 AM   CHOLHDL 5.6 (H) 09/22/2019 08:19 AM   LDLCALC 85 09/22/2019 08:19 AM    Wt Readings from Last 3 Encounters:  07/15/20 216 lb (98 kg)  02/23/20 215 lb (97.5 kg)  02/17/20 215 lb (97.5 kg)     Objective:    Vital Signs:  BP (!) 148/80   Ht 5\' 11"  (1.803 m)   Wt 216 lb (98 kg)   BMI 30.13 kg/m    Well nourished, well developed male in no acute distress. Well appearing, alert and conversant, regular work of breathing,  good skin color  Eyes- anicteric mouth- oral mucosa is pink  neuro- grossly intact skin- no apparent rash or lesions or cyanosis   ASSESSMENT & PLAN:    1.  OSA - The pathophysiology of obstructive sleep apnea , it's cardiovascular consequences & modes of treatment including CPAP were discused with the patient in detail & they evidenced understanding.  The patient is tolerating PAP therapy well without any problems. The PAP download was reviewed today and showed an AHI of 0.9/hr on auto CPAP cm H2O with 80% compliance in using more than 4 hours nightly.  The patient has been using and benefiting from PAP use and will continue to benefit from therapy.  -Will add a chin strap to help with mouth dryness -I have recommended that he change out the cushion every 6-8 weeks  2.  HTN -diet controlled  3.  Obesity I have encouraged him to get into a routine exercise program and cut back on carbs and portions.   COVID-19 Education: The signs and symptoms of COVID-19 were discussed with the patient and how to seek care for testing (follow up with PCP or arrange E-visit).  The importance of social distancing was discussed today.  Patient Risk:   After full review of this patient's clinical status, I feel that they are at least moderate risk at this time.  Time:   Today, I have spent 20 minutes on  telemedicine discussing medical problems including snoring and witnessed apnea and OSA and reviewing patient's chart including home sleep study and PAP compliance.  Medication Adjustments/Labs and Tests Ordered: Current medicines are reviewed at length with the patient today.  Concerns regarding medicines are outlined above.  Tests Ordered: No orders of the defined types were placed in this encounter.  Medication Changes: No orders of the defined types were placed in this encounter.   Disposition:  Follow up 3 months virtual  Signed, , MD  07/15/2020 9:27 AM    [facility]

## 2020-07-15 NOTE — Telephone Encounter (Signed)
-----   Message from Quintella Reichert, MD sent at 07/15/2020  9:46 AM EST ----- Please order a chin strap

## 2020-07-15 NOTE — Telephone Encounter (Signed)
Order placed to [facility] via fax. 

## 2020-10-19 ENCOUNTER — Other Ambulatory Visit: Payer: Self-pay

## 2020-10-19 ENCOUNTER — Telehealth: Payer: BC Managed Care – PPO | Admitting: Cardiology

## 2020-10-21 ENCOUNTER — Encounter: Payer: Self-pay | Admitting: Cardiology

## 2020-10-21 ENCOUNTER — Telehealth: Payer: Self-pay

## 2020-10-21 ENCOUNTER — Encounter: Payer: BC Managed Care – PPO | Admitting: Cardiology

## 2020-10-21 ENCOUNTER — Other Ambulatory Visit: Payer: Self-pay

## 2020-10-21 NOTE — Progress Notes (Signed)
Erroneous encounter This encounter was created in error - please disregard. This encounter was created in error - please disregard. 

## 2020-10-21 NOTE — Telephone Encounter (Signed)
  Patient Consent for Virtual Visit        Mark Mcclain has provided verbal consent on 10/21/2020 for a virtual visit (video or telephone).   CONSENT FOR VIRTUAL VISIT FOR:  Mark Mcclain  By participating in this virtual visit I agree to the following:  I hereby voluntarily request, consent and authorize [facility] and its employed or contracted physicians, physician assistants, nurse practitioners or other licensed health care professionals (the Practitioner), to provide me with telemedicine health care services (the "Services") as deemed necessary by the treating Practitioner. I acknowledge and consent to receive the Services by the Practitioner via telemedicine. I understand that the telemedicine visit will involve communicating with the Practitioner through live audiovisual communication technology and the disclosure of certain medical information by electronic transmission. I acknowledge that I have been given the opportunity to request an in-person assessment or other available alternative prior to the telemedicine visit and am voluntarily participating in the telemedicine visit.  I understand that I have the right to withhold or withdraw my consent to the use of telemedicine in the course of my care at any time, without affecting my right to future care or treatment, and that the Practitioner or I may terminate the telemedicine visit at any time. I understand that I have the right to inspect all information obtained and/or recorded in the course of the telemedicine visit and may receive copies of available information for a reasonable fee.  I understand that some of the potential risks of receiving the Services via telemedicine include:  Delay or interruption in medical evaluation due to technological equipment failure or disruption; Information transmitted may not be sufficient (e.g. poor resolution of images) to allow for appropriate medical decision making by the Practitioner; and/or   In rare instances, security protocols could fail, causing a breach of personal health information.  Furthermore, I acknowledge that it is my responsibility to provide information about my medical history, conditions and care that is complete and accurate to the best of my ability. I acknowledge that Practitioner's advice, recommendations, and/or decision may be based on factors not within their control, such as incomplete or inaccurate data provided by me or distortions of diagnostic images or specimens that may result from electronic transmissions. I understand that the practice of medicine is not an exact science and that Practitioner makes no warranties or guarantees regarding treatment outcomes. I acknowledge that a copy of this consent can be made available to me via my patient portal [facility] MyChart), or I can request a printed copy by calling the office of [facility].    I understand that my insurance will be billed for this visit.   I have read or had this consent read to me. I understand the contents of this consent, which adequately explains the benefits and risks of the Services being provided via telemedicine.  I have been provided ample opportunity to ask questions regarding this consent and the Services and have had my questions answered to my satisfaction. I give my informed consent for the services to be provided through the use of telemedicine in my medical care

## 2020-11-08 DIAGNOSIS — E559 Vitamin D deficiency, unspecified: Secondary | ICD-10-CM | POA: Diagnosis not present

## 2020-11-08 DIAGNOSIS — E785 Hyperlipidemia, unspecified: Secondary | ICD-10-CM | POA: Diagnosis not present

## 2020-11-08 DIAGNOSIS — Z1331 Encounter for screening for depression: Secondary | ICD-10-CM | POA: Diagnosis not present

## 2020-11-08 DIAGNOSIS — R5383 Other fatigue: Secondary | ICD-10-CM | POA: Diagnosis not present

## 2020-11-08 DIAGNOSIS — I10 Essential (primary) hypertension: Secondary | ICD-10-CM | POA: Diagnosis not present

## 2020-11-14 DIAGNOSIS — G4733 Obstructive sleep apnea (adult) (pediatric): Secondary | ICD-10-CM | POA: Diagnosis not present

## 2020-12-15 DIAGNOSIS — G4733 Obstructive sleep apnea (adult) (pediatric): Secondary | ICD-10-CM | POA: Diagnosis not present

## 2021-01-04 DIAGNOSIS — I517 Cardiomegaly: Secondary | ICD-10-CM | POA: Diagnosis not present

## 2021-01-04 DIAGNOSIS — R609 Edema, unspecified: Secondary | ICD-10-CM | POA: Diagnosis not present

## 2021-01-04 DIAGNOSIS — R600 Localized edema: Secondary | ICD-10-CM | POA: Diagnosis not present

## 2021-01-04 DIAGNOSIS — J811 Chronic pulmonary edema: Secondary | ICD-10-CM | POA: Diagnosis not present

## 2021-01-12 DIAGNOSIS — Z125 Encounter for screening for malignant neoplasm of prostate: Secondary | ICD-10-CM | POA: Diagnosis not present

## 2021-01-12 DIAGNOSIS — Z131 Encounter for screening for diabetes mellitus: Secondary | ICD-10-CM | POA: Diagnosis not present

## 2021-01-12 DIAGNOSIS — I517 Cardiomegaly: Secondary | ICD-10-CM | POA: Diagnosis not present

## 2021-01-12 DIAGNOSIS — R609 Edema, unspecified: Secondary | ICD-10-CM | POA: Diagnosis not present

## 2021-01-12 DIAGNOSIS — J811 Chronic pulmonary edema: Secondary | ICD-10-CM | POA: Diagnosis not present

## 2021-01-12 DIAGNOSIS — R0600 Dyspnea, unspecified: Secondary | ICD-10-CM | POA: Diagnosis not present

## 2021-01-12 DIAGNOSIS — Z820 Family history of epilepsy and other diseases of the nervous system: Secondary | ICD-10-CM | POA: Diagnosis not present

## 2021-01-12 DIAGNOSIS — R5383 Other fatigue: Secondary | ICD-10-CM | POA: Diagnosis not present

## 2021-01-15 DIAGNOSIS — G4733 Obstructive sleep apnea (adult) (pediatric): Secondary | ICD-10-CM | POA: Diagnosis not present

## 2021-01-17 DIAGNOSIS — M25561 Pain in right knee: Secondary | ICD-10-CM | POA: Diagnosis not present

## 2021-01-17 DIAGNOSIS — N281 Cyst of kidney, acquired: Secondary | ICD-10-CM | POA: Diagnosis not present

## 2021-01-17 DIAGNOSIS — I517 Cardiomegaly: Secondary | ICD-10-CM | POA: Diagnosis not present

## 2021-01-17 DIAGNOSIS — I10 Essential (primary) hypertension: Secondary | ICD-10-CM | POA: Diagnosis not present

## 2021-01-19 DIAGNOSIS — M25561 Pain in right knee: Secondary | ICD-10-CM | POA: Diagnosis not present

## 2021-01-19 DIAGNOSIS — R609 Edema, unspecified: Secondary | ICD-10-CM | POA: Diagnosis not present

## 2021-01-19 DIAGNOSIS — M25469 Effusion, unspecified knee: Secondary | ICD-10-CM | POA: Diagnosis not present

## 2021-01-19 DIAGNOSIS — R0600 Dyspnea, unspecified: Secondary | ICD-10-CM | POA: Diagnosis not present

## 2021-01-20 DIAGNOSIS — R0602 Shortness of breath: Secondary | ICD-10-CM | POA: Diagnosis not present

## 2021-01-20 DIAGNOSIS — M25561 Pain in right knee: Secondary | ICD-10-CM | POA: Diagnosis not present

## 2021-01-20 DIAGNOSIS — I2699 Other pulmonary embolism without acute cor pulmonale: Secondary | ICD-10-CM | POA: Diagnosis not present

## 2021-01-26 IMAGING — CT CT CARDIAC CORONARY ARTERY CALCIUM SCORE
3 series · 14 of 20 positions shown, 15 images · non-contrast
Comparison: None.
COMPARISON: None.

Addendum:
EXAM:
OVER-READ INTERPRETATION  CT CHEST

The following report is an over-read performed by radiologist Dr.
Carl Brian Morota [REDACTED] on 09/09/2019. This
over-read does not include interpretation of cardiac or coronary
anatomy or pathology. The coronary calcium score interpretation by
the cardiologist is attached.
CLINICAL DATA: Risk stratification
Coronary Calcium Score
TECHNIQUE: The patient was scanned on a Siemens Force scanner. Axial
non-contrast 3 mm slices were carried out through the heart. The
data set was analyzed on a dedicated work station and scored using
the Agatson method.

[Series 2: casc 3.0 bv41 2 bestdiast 68 % · axial · 0.38mm/px · z∈[-250,-169]mm · 4 of 47 slices shown, 5 images]
[im 10/47  vessel]
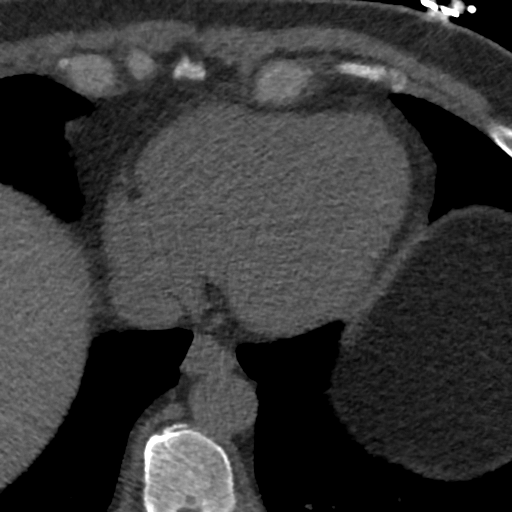
[im 10/47  lung]
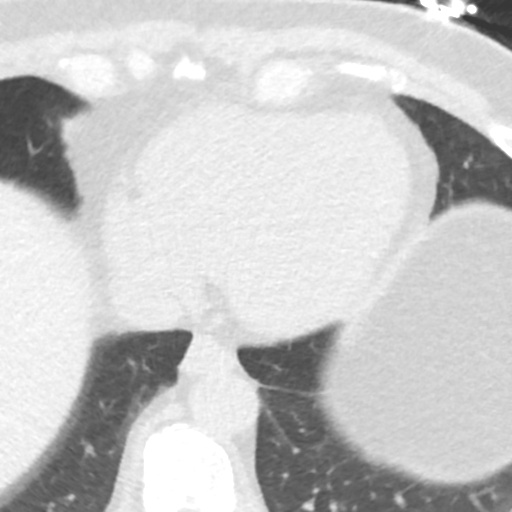
[im 19/47  vessel]
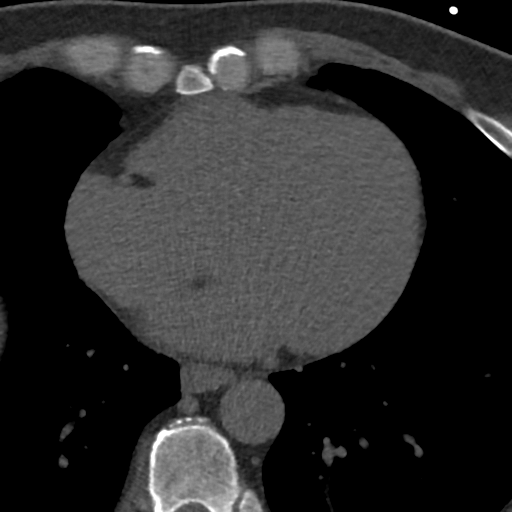
[im 28/47  vessel]
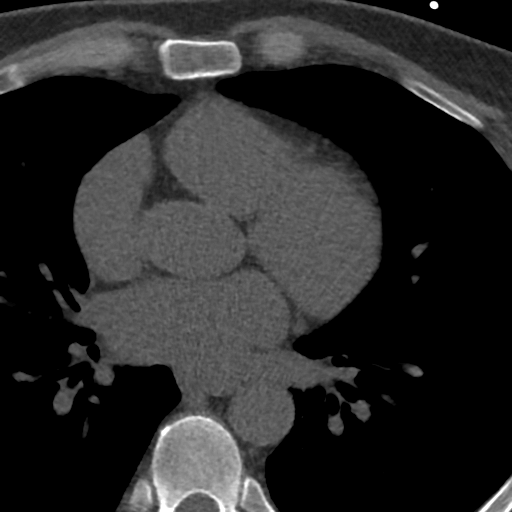
[im 37/47  vessel]
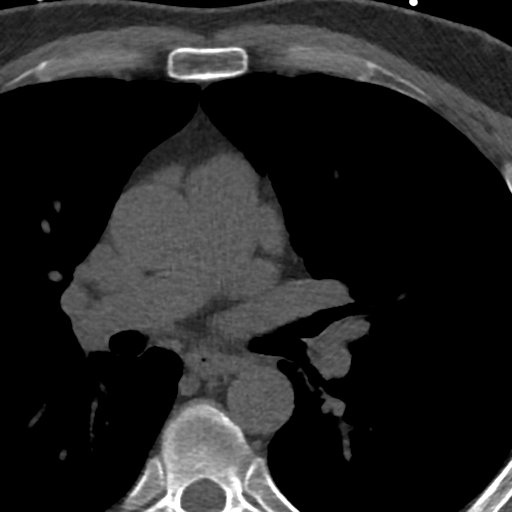

[Series 3: lung 69 % · axial · 0.73mm/px · z∈[-256,-162]mm · 5 of 47 slices shown]
[im 8/47  lung]
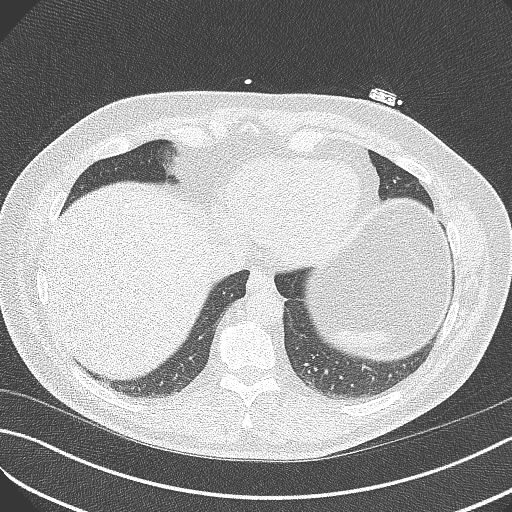
[im 16/47  lung]
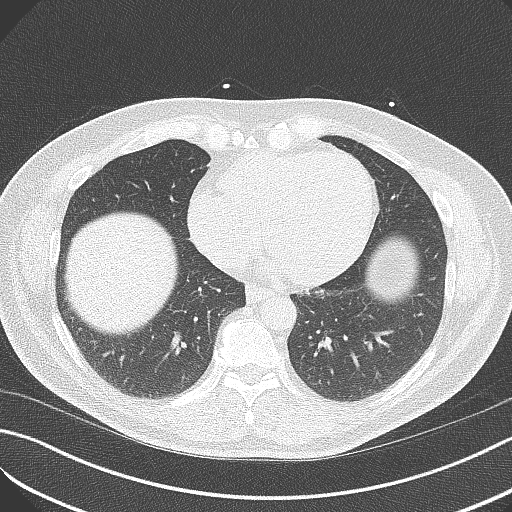
[im 24/47  lung]
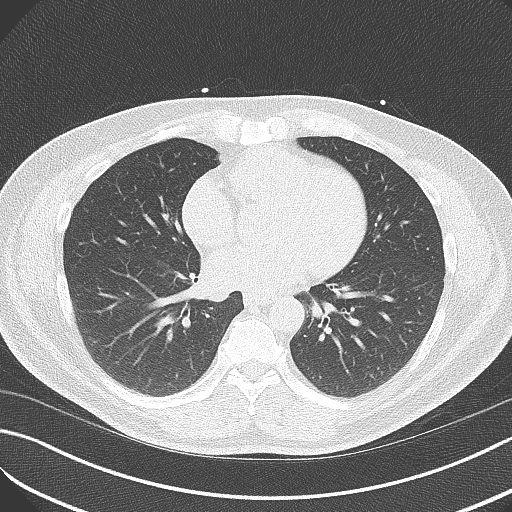
[im 31/47  lung]
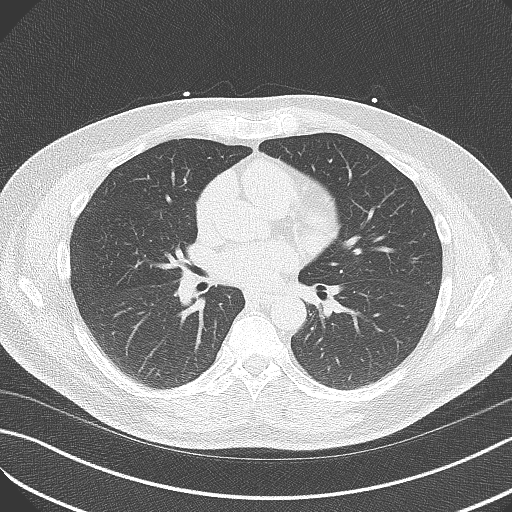
[im 39/47  lung]
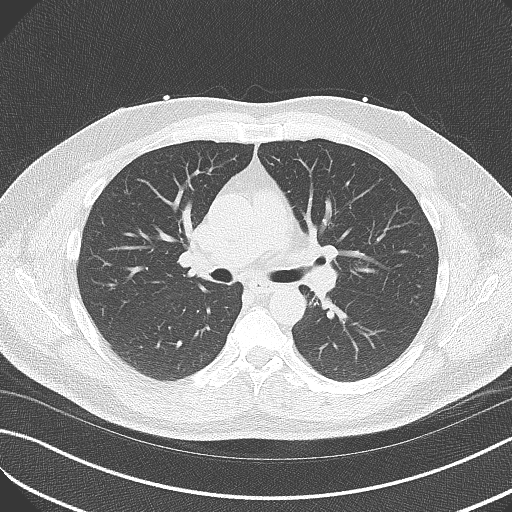

[Series 4: lung st 69 % · axial · 0.73mm/px · z∈[-256,-162]mm · 5 of 47 slices shown]
[im 8/47  lung]
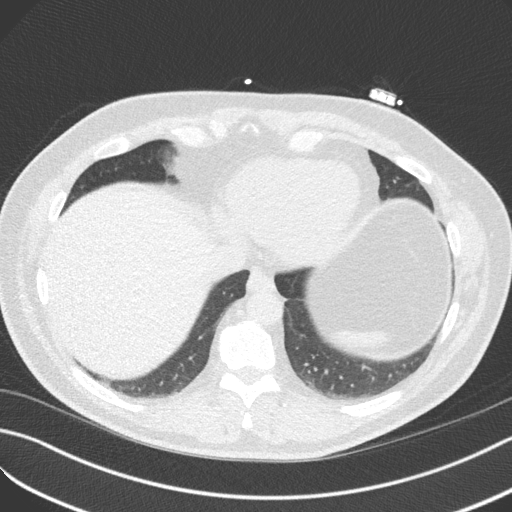
[im 16/47  lung]
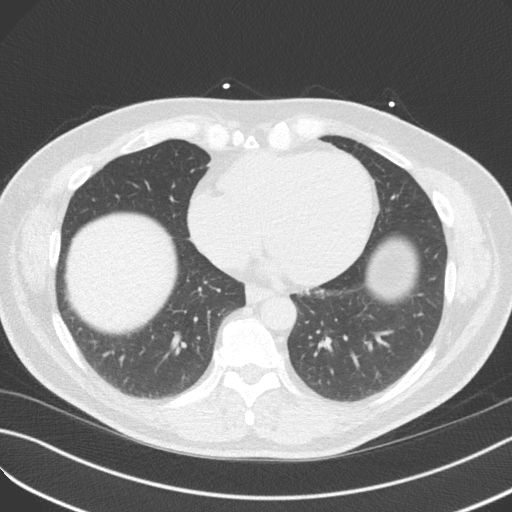
[im 24/47  lung]
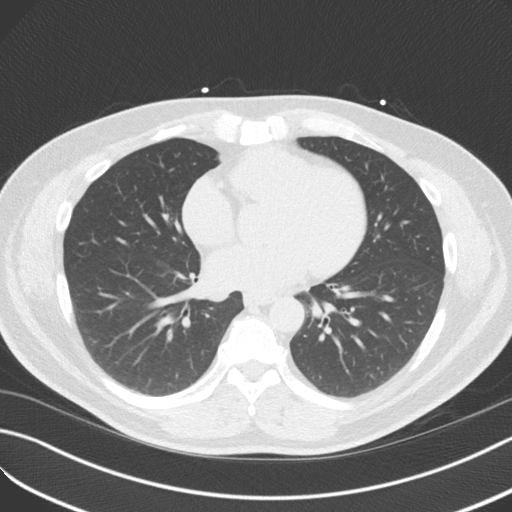
[im 31/47  lung]
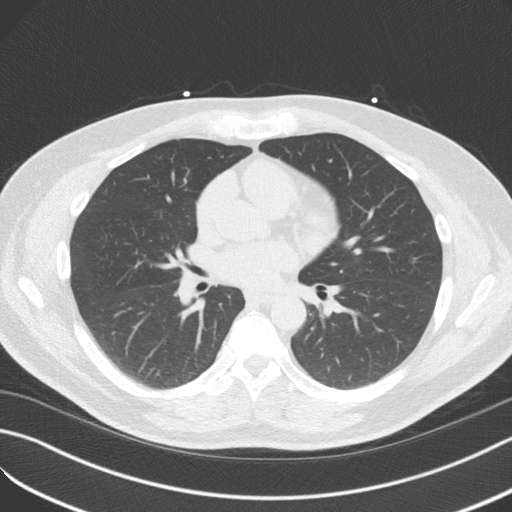
[im 39/47  lung]
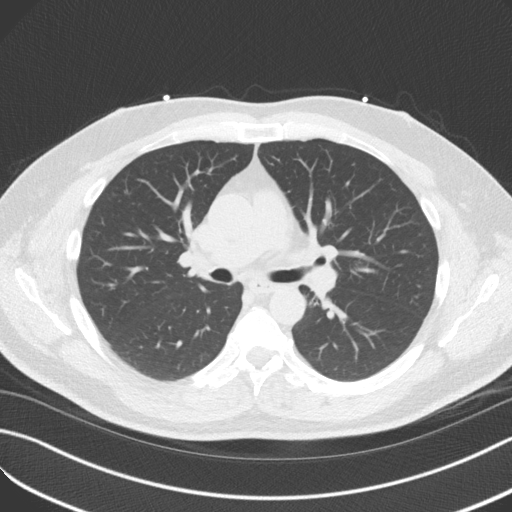

[14 of 20 positions shown; findings below may reference images not displayed]

FINDINGS: Within the visualized portions of the thorax there are no suspicious
appearing pulmonary nodules or masses, there is no acute
consolidative airspace disease, no pleural effusions, no
pneumothorax and no lymphadenopathy. Visualized portions of the
upper abdomen are unremarkable. There are no aggressive appearing
lytic or blastic lesions noted in the visualized portions of the
skeleton.
IMPRESSION: 1. No significant incidental noncardiac findings are noted
FINDINGS: Non-cardiac: See separate report from [REDACTED].

Ascending Aorta: Normal

Pericardium: Normal

Coronary arteries: Normal origin.
IMPRESSION: Coronary calcium score of 0 . This was 0 percentile for age and sex
matched control.

Br Hm Nakhil,DO

*** End of Addendum ***
EXAM:
OVER-READ INTERPRETATION  CT CHEST

The following report is an over-read performed by radiologist Dr.
Carl Brian Morota [REDACTED] on 09/09/2019. This
over-read does not include interpretation of cardiac or coronary
anatomy or pathology. The coronary calcium score interpretation by
the cardiologist is attached.
FINDINGS: Within the visualized portions of the thorax there are no suspicious
appearing pulmonary nodules or masses, there is no acute
consolidative airspace disease, no pleural effusions, no
pneumothorax and no lymphadenopathy. Visualized portions of the
upper abdomen are unremarkable. There are no aggressive appearing
lytic or blastic lesions noted in the visualized portions of the
skeleton.
IMPRESSION: 1. No significant incidental noncardiac findings are noted

## 2021-02-14 DIAGNOSIS — G4733 Obstructive sleep apnea (adult) (pediatric): Secondary | ICD-10-CM | POA: Diagnosis not present

## 2021-02-22 DIAGNOSIS — R079 Chest pain, unspecified: Secondary | ICD-10-CM | POA: Diagnosis not present

## 2021-02-22 DIAGNOSIS — R0600 Dyspnea, unspecified: Secondary | ICD-10-CM | POA: Diagnosis not present

## 2021-03-17 DIAGNOSIS — G4733 Obstructive sleep apnea (adult) (pediatric): Secondary | ICD-10-CM | POA: Diagnosis not present

## 2021-03-22 DIAGNOSIS — E559 Vitamin D deficiency, unspecified: Secondary | ICD-10-CM | POA: Diagnosis not present

## 2021-03-22 DIAGNOSIS — Z23 Encounter for immunization: Secondary | ICD-10-CM | POA: Diagnosis not present

## 2021-03-22 DIAGNOSIS — I10 Essential (primary) hypertension: Secondary | ICD-10-CM | POA: Diagnosis not present

## 2021-03-22 DIAGNOSIS — R5383 Other fatigue: Secondary | ICD-10-CM | POA: Diagnosis not present

## 2021-03-22 DIAGNOSIS — E785 Hyperlipidemia, unspecified: Secondary | ICD-10-CM | POA: Diagnosis not present

## 2021-04-11 DIAGNOSIS — M25561 Pain in right knee: Secondary | ICD-10-CM | POA: Diagnosis not present

## 2021-04-19 DIAGNOSIS — M25561 Pain in right knee: Secondary | ICD-10-CM | POA: Diagnosis not present

## 2021-05-13 DIAGNOSIS — R0981 Nasal congestion: Secondary | ICD-10-CM | POA: Diagnosis not present

## 2021-05-13 DIAGNOSIS — Z20828 Contact with and (suspected) exposure to other viral communicable diseases: Secondary | ICD-10-CM | POA: Diagnosis not present

## 2021-05-13 DIAGNOSIS — R059 Cough, unspecified: Secondary | ICD-10-CM | POA: Diagnosis not present

## 2021-05-13 DIAGNOSIS — J3489 Other specified disorders of nose and nasal sinuses: Secondary | ICD-10-CM | POA: Diagnosis not present

## 2021-05-17 DIAGNOSIS — G4733 Obstructive sleep apnea (adult) (pediatric): Secondary | ICD-10-CM | POA: Diagnosis not present

## 2021-06-08 DIAGNOSIS — M6751 Plica syndrome, right knee: Secondary | ICD-10-CM | POA: Diagnosis not present

## 2021-06-08 DIAGNOSIS — Y999 Unspecified external cause status: Secondary | ICD-10-CM | POA: Diagnosis not present

## 2021-06-08 DIAGNOSIS — X58XXXA Exposure to other specified factors, initial encounter: Secondary | ICD-10-CM | POA: Diagnosis not present

## 2021-06-08 DIAGNOSIS — S83231A Complex tear of medial meniscus, current injury, right knee, initial encounter: Secondary | ICD-10-CM | POA: Diagnosis not present

## 2021-06-08 DIAGNOSIS — M2241 Chondromalacia patellae, right knee: Secondary | ICD-10-CM | POA: Diagnosis not present

## 2021-06-08 DIAGNOSIS — S83241A Other tear of medial meniscus, current injury, right knee, initial encounter: Secondary | ICD-10-CM | POA: Diagnosis not present

## 2021-06-14 DIAGNOSIS — M25661 Stiffness of right knee, not elsewhere classified: Secondary | ICD-10-CM | POA: Diagnosis not present

## 2021-06-14 DIAGNOSIS — M6281 Muscle weakness (generalized): Secondary | ICD-10-CM | POA: Diagnosis not present

## 2021-06-17 DIAGNOSIS — G4733 Obstructive sleep apnea (adult) (pediatric): Secondary | ICD-10-CM | POA: Diagnosis not present

## 2021-06-20 DIAGNOSIS — M6281 Muscle weakness (generalized): Secondary | ICD-10-CM | POA: Diagnosis not present

## 2021-06-20 DIAGNOSIS — M25661 Stiffness of right knee, not elsewhere classified: Secondary | ICD-10-CM | POA: Diagnosis not present

## 2021-07-15 DIAGNOSIS — G4733 Obstructive sleep apnea (adult) (pediatric): Secondary | ICD-10-CM | POA: Diagnosis not present

## 2021-07-19 ENCOUNTER — Telehealth: Payer: Self-pay | Admitting: Cardiology

## 2021-07-19 DIAGNOSIS — I10 Essential (primary) hypertension: Secondary | ICD-10-CM | POA: Diagnosis not present

## 2021-07-19 DIAGNOSIS — E785 Hyperlipidemia, unspecified: Secondary | ICD-10-CM | POA: Diagnosis not present

## 2021-07-19 DIAGNOSIS — Z6830 Body mass index (BMI) 30.0-30.9, adult: Secondary | ICD-10-CM | POA: Diagnosis not present

## 2021-07-19 NOTE — Telephone Encounter (Signed)
?  Pt is calling to f/r his CPAP machine, he said she has some questions and there's some parts of the CPAP that he has not receive yet ?

## 2021-07-26 NOTE — Telephone Encounter (Signed)
Called pt lmtcb

## 2021-08-22 DIAGNOSIS — N289 Disorder of kidney and ureter, unspecified: Secondary | ICD-10-CM | POA: Diagnosis not present

## 2021-08-24 NOTE — Telephone Encounter (Signed)
Called patient who states he can not tell his cpap is on like he is not getting enough air. He is very tired during the day. Patient states his pressure starts at 4 and never moves any further. I reached out to [facility]) who states his pressure goes to 14 when he is sleeping. Beth said to let the patient know that when he is awake he controls his breathing not the cpap and he needs to improve his usage. Reached out to patient who expressed understanding. ? ?Mark, Mcclain ?07/25/2021 - 08/23/2021 ?DOB: 1965/11/12 ?Age: 56 years ?[name] [address] ?[address] ?[place] ?[place], [address] ?Phone: [phone] ?Compliance Report ?Usage 07/25/2021 - 08/23/2021 ?Usage days 14/30 days (47%) ?>= 4 hours 5 days (17%) ?< 4 hours 9 days (30%) ?Usage hours 48 hours 11 minutes ?Average usage (total days) 1 hours 36 minutes ?Average usage (days used) 3 hours 27 minutes ?Median usage (days used) 3 hours 36 minutes ?Total used hours (value since last reset - 08/23/2021) 2,063 hours ?AirSense 11 AutoSet ?Serial number [id number] ?Mode AutoSet ?Min Pressure 4 cmH2O ?Max Pressure 20 cmH2O ?EPR Fulltime ?EPR level 2 ?Response Standard ?Therapy ?Pressure - cmH2O Median: 10.6 95th percentile: 14.9 Maximum: 16.4 ?Leaks - L/min Median: 1.1 95th percentile: 6.1 Maximum: 13.2 ?Events per hour AI: 0.5 HI: 0.4 AHI: 0.9 ?Apnea Index Central: 0.2 Obstructive: 0.3 Unknown: 0.0 ?RERA Index 0.7 ?Cheyne-Stokes respiration (average duration per night) 0 minutes (0%) ?Usage - hours ?Printed on 08/24/2021 - ResMed AirView version 4.40.0-1.0 Page 1 of  ?

## 2021-08-25 NOTE — Telephone Encounter (Signed)
Patient states he will be more compliant. ?

## 2021-08-29 NOTE — Progress Notes (Signed)
? ?Virtual Visit via Video  Note  ? ?This visit type was conducted due to national recommendations for restrictions regarding the COVID-19 Pandemic (e.g. social distancing) in an effort to limit this patient's exposure and mitigate transmission in our community.  Due to his co-morbid illnesses, this patient is at least at moderate risk for complications without adequate follow up.  This format is felt to be most appropriate for this patient at this time.  The patient did not have access to video technology/had technical difficulties with video requiring transit  Please refer to the patient's chart for his  consent to telehealth for [facility]. ?  ?Evaluation Performed:  Follow-up visit ? ?Date:  08/30/2021  ? ?ID:  Mark Mcclain, DOB 1966-02-03, MRN [id number] ? ?Patient Location:  ?Home ? ?Provider location:   ?[facility] ? ?PCP:  Marlyn Corporal, PA  ?Cardiologist:  Belva Crome, MD ?Electrophysiologist:  None  ? ?Chief Complaint:  OSA ? ?History of Present Illness:   ? ?Mark Mcclain is a 56 y.o. male who presents via audio/video conferencing for a telehealth visit today.   ? ?This is a 56yo male with a hx of HTN, snoring and witnessed apnea. He was referred by Dr. Tomie China for evaluation of excessive daytime sleepiness.  He underwent a home sleep study which showed severe OSA with an AHI of 37.8/hr with no central apneas and nocturnal hypoxemia with O2 sats as low as 82%.  He was placed on auto CPAP from 4 to 20cm H2O and his now here for followup.  ? ?He is doing well with his CPAP device and thinks that he has gotten used to it.  He tolerates the mask although has issues with his mask getting loose at night.  He has tried multiple masks but they make marks and irritate his skin and do not fit well so they get loose at night and wakes him up multiple times at night. He feels the pressure is adequate.  Since going on CPAP he feels rested in the am and has no significant daytime sleepiness.  He denies any  significant nasal dryness.  He does have some problems with nasal congestion and  he takes his allergy medication.  He is having problems with dry mouth. He does not use a chin strap.  He does not think that he snores.    ? ?The patient does not have symptoms concerning for COVID-19 infection (fever, chills, cough, or new shortness of breath).  ? ? ?Prior sleep ? studies:   ?The following studies were reviewed today: ? ?Home sleep study, PAP compliance download ? ?Past Medical History:  ?Diagnosis Date  ? BMI 30.0-30.9,adult   ? Hyperlipidemia   ? Hypertension   ? Screening for depression   ? Weakness   ? ?Past Surgical History:  ?Procedure Laterality Date  ? NO PAST SURGERIES    ?  ? ?Current Meds  ?Medication Sig  ? cholecalciferol (VITAMIN D3) 25 MCG (1000 UNIT) tablet Take 1,000 Units by mouth daily.  ? hydrochlorothiazide (HYDRODIURIL) 25 MG tablet Take 25 mg by mouth daily.  ? losartan (COZAAR) 50 MG tablet Take 25 mg by mouth daily.  ? Multiple Vitamin (MULTIVITAMIN) tablet Take 1 tablet by mouth daily.  ? omega-3 acid ethyl esters (LOVAZA) 1 g capsule Take 2 capsules by mouth 2 (two) times daily.  ? rosuvastatin (CRESTOR) 10 MG tablet Take 10 mg by mouth daily.  ? Saccharomyces boulardii (PROBIOTIC) 250 MG CAPS Take 250 mg by mouth daily.  ?  vitamin B-12 (CYANOCOBALAMIN) 500 MCG tablet Take 500 mcg by mouth daily.  ?  ? ?Allergies:   Zithromax [azithromycin]  ? ?Social History  ? ?Tobacco Use  ? Smoking status: Never  ? Smokeless tobacco: Current  ?  Types: Chew  ?  ? ?Family Hx: ?The patient's family history is not on file. ? ?ROS:   ?Please see the history of present illness.    ? ?All other systems reviewed and are negative. ? ? ?Labs/Other Tests and Data Reviewed:   ? ?Recent Labs: ?No results found for requested labs within last 8760 hours.  ? ?Recent Lipid Panel ?Lab Results  ?Component Value Date/Time  ? CHOL 156 09/22/2019 08:19 AM  ? TRIG 256 (H) 09/22/2019 08:19 AM  ? HDL 28 (L) 09/22/2019 08:19 AM   ? CHOLHDL 5.6 (H) 09/22/2019 08:19 AM  ? LDLCALC 85 09/22/2019 08:19 AM  ? ? ?Wt Readings from Last 3 Encounters:  ?08/30/21 223 lb (101.2 kg)  ?10/21/20 219 lb (99.3 kg)  ?07/15/20 216 lb (98 kg)  ?  ? ?Objective:   ? ?Vital Signs:  Ht 5\' 11"  (1.803 m)   Wt 223 lb (101.2 kg)   BMI 31.10 kg/m?   ? ?Well nourished, well developed male in no acute distress. ?Well appearing, alert and conversant, regular work of breathing,  good skin color  ?Eyes- anicteric ?mouth- oral mucosa is pink  ?neuro- grossly intact ?skin- no apparent rash or lesions or cyanosis  ? ?ASSESSMENT & PLAN:   ? ?1.  OSA - The patient is tolerating PAP therapy well without any problems. The PAP download performed by his DME was personally reviewed and interpreted by me today and showed an AHI of 1/hr on AutoPap cm H2O with 20% compliance in using more than 4 hours nightly.  The patient has been using and benefiting from PAP use and will continue to benefit from therapy.  ?-I encouraged him to be more compliant with his device ?-we discussed the Inspire device but he wants to work more with his CPAP ?-I will order a chin strap to help with mouth dryness ? ? 2.  HTN ?-BP is controlled at home ?-Continue prescription drug management with losartan 25 mg daily with as needed refills ? ? ?COVID-19 Education: ?The signs and symptoms of COVID-19 were discussed with the patient and how to seek care for testing (follow up with PCP or arrange E-visit).  The importance of social distancing was discussed today. ? ?Patient Risk:   ?After full review of this patient's clinical status, I feel that they are at least moderate risk at this time. ? ?Time:   ?Today, I have spent 15 minutes on telemedicine discussing medical problems including snoring and witnessed apnea and OSA and reviewing patient's chart including home sleep study and PAP compliance. ? ?Medication Adjustments/Labs and Tests Ordered: ?Current medicines are reviewed at length with the patient today.   Concerns regarding medicines are outlined above.  ?Tests Ordered: ?No orders of the defined types were placed in this encounter. ? ?Medication Changes: ?No orders of the defined types were placed in this encounter. ? ? ?Disposition:  Follow up 6 months virtual ? ?Signed, ? , MD  ?08/30/2021 8:30 AM    ?[facility] ?

## 2021-08-30 ENCOUNTER — Encounter: Payer: Self-pay | Admitting: Cardiology

## 2021-08-30 ENCOUNTER — Telehealth (INDEPENDENT_AMBULATORY_CARE_PROVIDER_SITE_OTHER): Payer: BC Managed Care – PPO | Admitting: Cardiology

## 2021-08-30 VITALS — Ht 71.0 in | Wt 223.0 lb

## 2021-08-30 DIAGNOSIS — R0602 Shortness of breath: Secondary | ICD-10-CM | POA: Diagnosis not present

## 2021-08-30 DIAGNOSIS — J309 Allergic rhinitis, unspecified: Secondary | ICD-10-CM | POA: Diagnosis not present

## 2021-08-30 DIAGNOSIS — R5383 Other fatigue: Secondary | ICD-10-CM | POA: Diagnosis not present

## 2021-08-30 DIAGNOSIS — G4733 Obstructive sleep apnea (adult) (pediatric): Secondary | ICD-10-CM

## 2021-08-30 DIAGNOSIS — Z9989 Dependence on other enabling machines and devices: Secondary | ICD-10-CM

## 2021-08-30 DIAGNOSIS — I10 Essential (primary) hypertension: Secondary | ICD-10-CM | POA: Diagnosis not present

## 2021-08-30 DIAGNOSIS — R42 Dizziness and giddiness: Secondary | ICD-10-CM | POA: Diagnosis not present

## 2021-08-30 NOTE — Patient Instructions (Signed)
Medication Instructions:  ?Your physician recommends that you continue on your current medications as directed. Please refer to the Current Medication list given to you today. ? ?*If you need a refill on your cardiac medications before your next appointment, please call your pharmacy* ? ? ?Follow-Up: ?At [facility], you and your health needs are our priority.  As part of our continuing mission to provide you with exceptional heart care, we have created designated Provider Care Teams.  These Care Teams include your primary Cardiologist (physician) and Advanced Practice Providers (APPs -  Physician Assistants and Nurse Practitioners) who all work together to provide you with the care you need, when you need it. ?  ? ?Your next appointment:   ?October 30th, 2023 at 8:20am ? ?The format for your next appointment:   ?Virtual Visit  ? ?Provider:   ?Armanda Magic, MD   ? ?Important Information About Sugar ? ? ? ? ?  ?

## 2021-09-01 ENCOUNTER — Telehealth: Payer: Self-pay | Admitting: *Deleted

## 2021-09-01 DIAGNOSIS — G4733 Obstructive sleep apnea (adult) (pediatric): Secondary | ICD-10-CM

## 2021-09-01 NOTE — Telephone Encounter (Signed)
Order placed to [facility]. °

## 2021-09-01 NOTE — Telephone Encounter (Signed)
-----   Message from Theresia Majors, RN sent at 08/30/2021  8:40 AM EDT ----- ?Per Dr. Mayford Knife: ?Please order patient a chin strap.  ?Thanks! ? ?

## 2021-09-06 DIAGNOSIS — R42 Dizziness and giddiness: Secondary | ICD-10-CM | POA: Diagnosis not present

## 2021-10-18 DIAGNOSIS — G4733 Obstructive sleep apnea (adult) (pediatric): Secondary | ICD-10-CM | POA: Diagnosis not present

## 2021-10-24 DIAGNOSIS — I10 Essential (primary) hypertension: Secondary | ICD-10-CM | POA: Diagnosis not present

## 2021-10-24 DIAGNOSIS — G4733 Obstructive sleep apnea (adult) (pediatric): Secondary | ICD-10-CM | POA: Diagnosis not present

## 2021-10-24 DIAGNOSIS — E785 Hyperlipidemia, unspecified: Secondary | ICD-10-CM | POA: Diagnosis not present

## 2021-10-24 DIAGNOSIS — N400 Enlarged prostate without lower urinary tract symptoms: Secondary | ICD-10-CM | POA: Diagnosis not present

## 2021-10-30 ENCOUNTER — Telehealth: Payer: BC Managed Care – PPO | Admitting: Cardiology

## 2021-12-20 DIAGNOSIS — E785 Hyperlipidemia, unspecified: Secondary | ICD-10-CM | POA: Diagnosis not present

## 2021-12-20 DIAGNOSIS — I10 Essential (primary) hypertension: Secondary | ICD-10-CM | POA: Diagnosis not present

## 2022-01-16 DIAGNOSIS — N189 Chronic kidney disease, unspecified: Secondary | ICD-10-CM | POA: Diagnosis not present

## 2022-01-16 DIAGNOSIS — J069 Acute upper respiratory infection, unspecified: Secondary | ICD-10-CM | POA: Diagnosis not present

## 2022-01-16 DIAGNOSIS — Z6830 Body mass index (BMI) 30.0-30.9, adult: Secondary | ICD-10-CM | POA: Diagnosis not present

## 2022-01-16 DIAGNOSIS — N281 Cyst of kidney, acquired: Secondary | ICD-10-CM | POA: Diagnosis not present

## 2022-02-13 DIAGNOSIS — I10 Essential (primary) hypertension: Secondary | ICD-10-CM | POA: Diagnosis not present

## 2022-02-13 DIAGNOSIS — M79672 Pain in left foot: Secondary | ICD-10-CM | POA: Diagnosis not present

## 2022-02-13 DIAGNOSIS — Z6830 Body mass index (BMI) 30.0-30.9, adult: Secondary | ICD-10-CM | POA: Diagnosis not present

## 2022-02-13 DIAGNOSIS — E785 Hyperlipidemia, unspecified: Secondary | ICD-10-CM | POA: Diagnosis not present

## 2022-02-20 DIAGNOSIS — N401 Enlarged prostate with lower urinary tract symptoms: Secondary | ICD-10-CM | POA: Diagnosis not present

## 2022-02-20 DIAGNOSIS — R351 Nocturia: Secondary | ICD-10-CM | POA: Diagnosis not present

## 2022-02-20 DIAGNOSIS — N281 Cyst of kidney, acquired: Secondary | ICD-10-CM | POA: Diagnosis not present

## 2022-03-05 ENCOUNTER — Ambulatory Visit: Payer: BC Managed Care – PPO | Attending: Cardiology | Admitting: Cardiology

## 2022-03-05 VITALS — BP 119/70 | Ht 71.0 in | Wt 220.0 lb

## 2022-03-05 DIAGNOSIS — I10 Essential (primary) hypertension: Secondary | ICD-10-CM

## 2022-03-05 DIAGNOSIS — G4733 Obstructive sleep apnea (adult) (pediatric): Secondary | ICD-10-CM | POA: Diagnosis not present

## 2022-03-05 NOTE — Progress Notes (Signed)
Virtual Visit via Video  Note   This visit type was conducted due to national recommendations for restrictions regarding the COVID-19 Pandemic (e.g. social distancing) in an effort to limit this patient's exposure and mitigate transmission in our community.  Due to his co-morbid illnesses, this patient is at least at moderate risk for complications without adequate follow up.  This format is felt to be most appropriate for this patient at this time.  The patient did not have access to video technology/had technical difficulties with video requiring transit  Please refer to the patient's chart for his  consent to telehealth for [place].   Evaluation Performed:  Follow-up visit  Date:  03/05/2022   ID:  Mark Mcclain, DOB 07-11-1965, MRN [id number]  Patient Location:  Home  Provider location:   [facility]  PCP:  Renaldo Reel, PA  Cardiologist:  Jyl Heinz, MD Electrophysiologist:  None   Chief Complaint:  OSA  History of Present Illness:    Mark Mcclain is a 56 y.o. male who presents via audio/video conferencing for a telehealth visit today.    This is a 56yo male with a hx of HTN, snoring and witnessed apnea. He was referred by Dr. Geraldo Pitter for evaluation of excessive daytime sleepiness.  He underwent a home sleep study which showed severe OSA with an AHI of 37.8/hr with no central apneas and nocturnal hypoxemia with O2 sats as low as 82%.  He was placed on auto CPAP from 4 to 20cm H2O and his now here for followup.   He is doing well with his PAP device and thinks that he has gotten used to it.  He has been having problems with his nasal mask creating what sounds like acne on his nasal bridge and he tries to pop them and then it creates an open sores and so he does not use the mask as much when the sores are open.   He called to get a FF mask but insurance says he has to be compliant before getting another mask. He has tried the under the nose FFM but feels he is suffocating  with it. He is using a tape over.  The patient does not have symptoms concerning for COVID-19 infection (fever, chills, cough, or new shortness of breath).    Prior sleep  studies:   The following studies were reviewed today:  Home sleep study, PAP compliance download  Past Medical History:  Diagnosis Date   BMI 30.0-30.9,adult    Hyperlipidemia    Hypertension    Screening for depression    Weakness    Past Surgical History:  Procedure Laterality Date   NO PAST SURGERIES       Current Meds  Medication Sig   cholecalciferol (VITAMIN D3) 25 MCG (1000 UNIT) tablet Take 1,000 Units by mouth daily.   hydrochlorothiazide (HYDRODIURIL) 25 MG tablet Take 25 mg by mouth daily.   losartan (COZAAR) 50 MG tablet Take 25 mg by mouth daily.   Multiple Vitamin (MULTIVITAMIN) tablet Take 1 tablet by mouth daily.   omega-3 acid ethyl esters (LOVAZA) 1 g capsule Take 2 capsules by mouth 2 (two) times daily.   rosuvastatin (CRESTOR) 10 MG tablet Take 10 mg by mouth daily.   tamsulosin (FLOMAX) 0.4 MG CAPS capsule Take 0.4 mg by mouth daily.   vitamin B-12 (CYANOCOBALAMIN) 500 MCG tablet Take 500 mcg by mouth daily.     Allergies:   Zithromax [azithromycin]   Social History   Tobacco Use  Smoking status: Never   Smokeless tobacco: Current    Types: Chew     Family Hx: The patient's family history is not on file.  ROS:   Please see the history of present illness.     All other systems reviewed and are negative.   Labs/Other Tests and Data Reviewed:    Recent Labs: No results found for requested labs within last 365 days.   Recent Lipid Panel Lab Results  Component Value Date/Time   CHOL 156 09/22/2019 08:19 AM   TRIG 256 (H) 09/22/2019 08:19 AM   HDL 28 (L) 09/22/2019 08:19 AM   CHOLHDL 5.6 (H) 09/22/2019 08:19 AM   LDLCALC 85 09/22/2019 08:19 AM    Wt Readings from Last 3 Encounters:  03/05/22 220 lb (99.8 kg)  08/30/21 223 lb (101.2 kg)  10/21/20 219 lb (99.3  kg)     Objective:    Vital Signs:  BP 119/70   Ht 5\' 11"  (1.803 m)   Wt 220 lb (99.8 kg)   BMI 30.68 kg/m    Well nourished, well developed male in no acute distress. Well appearing, alert and conversant, regular work of breathing,  good skin color  Eyes- anicteric mouth- oral mucosa is pink  neuro- grossly intact skin- no apparent rash or lesions or cyanosis  ASSESSMENT & PLAN:    1.  OSA - The patient is tolerating PAP therapy well without any problems. The PAP download performed by his DME was personally reviewed and interpreted by me today and showed an AHI of 0.5/hr on auto CPAP  with 50% compliance in using more than 4 hours nightly.  The patient has been using and benefiting from PAP use and will continue to benefit from therapy.  -encouraged him to be more compliant with his device -I have recommended that he see a dermatologist to see if anything can be done about the lesions that pop up on his nasal bridge -he is also seeing ENT for his nose that is crooked and he has been having difficulty breathing through his nose and causes him to breathe through his mouth and gives him mouth and throat dryness   2.  HTN -BP controlled at home -continue prescription drug management with Losartan 25mg  daily with PRN refills   COVID-19 Education: The signs and symptoms of COVID-19 were discussed with the patient and how to seek care for testing (follow up with PCP or arrange E-visit).  The importance of social distancing was discussed today.  Patient Risk:   After full review of this patient's clinical status, I feel that they are at least moderate risk at this time.  Time:   Today, I have spent 15 minutes on telemedicine discussing medical problems including snoring and witnessed apnea and OSA and reviewing patient's chart including home sleep study and PAP compliance.  Medication Adjustments/Labs and Tests Ordered: Current medicines are reviewed at length with the patient today.   Concerns regarding medicines are outlined above.  Tests Ordered: No orders of the defined types were placed in this encounter.  Medication Changes: No orders of the defined types were placed in this encounter.   Disposition:  Follow up 6 months virtual  Signed, , MD  03/05/2022 8:15 AM    [facility]

## 2022-03-05 NOTE — Patient Instructions (Signed)
Medication Instructions:  Your physician recommends that you continue on your current medications as directed. Please refer to the Current Medication list given to you today.  *If you need a refill on your cardiac medications before your next appointment, please call your pharmacy*  Lab Work: If you have labs (blood work) drawn today and your tests are completely normal, you will receive your results only by: [place] (if you have MyChart) OR A paper copy in the mail If you have any lab test that is abnormal or we need to change your treatment, we will call you to review the results.  Follow-Up: At [facility], you and your health needs are our priority.  As part of our continuing mission to provide you with exceptional heart care, we have created designated Provider Care Teams.  These Care Teams include your primary Cardiologist (physician) and Advanced Practice Providers (APPs -  Physician Assistants and Nurse Practitioners) who all work together to provide you with the care you need, when you need it.  We recommend signing up for the patient portal called "MyChart".  Sign up information is provided on this After Visit Summary.  MyChart is used to connect with patients for Virtual Visits (Telemedicine).  Patients are able to view lab/test results, encounter notes, upcoming appointments, etc.  Non-urgent messages can be sent to your provider as well.   To learn more about what you can do with MyChart, go to [email or web address].    Your next appointment:   6 month(s)  The format for your next appointment:   Virtual Visit   Provider:   Dr. Fransico Him     Important Information About Sugar

## 2022-03-11 DIAGNOSIS — M47816 Spondylosis without myelopathy or radiculopathy, lumbar region: Secondary | ICD-10-CM | POA: Diagnosis not present

## 2022-03-11 DIAGNOSIS — M545 Low back pain, unspecified: Secondary | ICD-10-CM | POA: Diagnosis not present

## 2022-03-11 DIAGNOSIS — R110 Nausea: Secondary | ICD-10-CM | POA: Diagnosis not present

## 2022-03-11 DIAGNOSIS — M5442 Lumbago with sciatica, left side: Secondary | ICD-10-CM | POA: Diagnosis not present

## 2022-03-11 DIAGNOSIS — M5116 Intervertebral disc disorders with radiculopathy, lumbar region: Secondary | ICD-10-CM | POA: Diagnosis not present

## 2022-03-16 ENCOUNTER — Telehealth: Payer: Self-pay | Admitting: Cardiology

## 2022-03-16 NOTE — Telephone Encounter (Signed)
Ethelda Chick, RN  [place], Electa Sniff,  This patient needed 6 month follow-up with Dr. Mayford Knife for sleep. Do I just put in a regular recall?   Thanks, Pam, RN   Called patient 3x to schedule 6 month f/u with no success, will send  MyChart message to patient.

## 2022-03-20 DIAGNOSIS — R0981 Nasal congestion: Secondary | ICD-10-CM | POA: Diagnosis not present

## 2022-03-20 DIAGNOSIS — J342 Deviated nasal septum: Secondary | ICD-10-CM | POA: Diagnosis not present

## 2022-03-20 DIAGNOSIS — Q385 Congenital malformations of palate, not elsewhere classified: Secondary | ICD-10-CM | POA: Diagnosis not present

## 2022-03-20 DIAGNOSIS — J343 Hypertrophy of nasal turbinates: Secondary | ICD-10-CM | POA: Diagnosis not present

## 2022-03-21 DIAGNOSIS — M5416 Radiculopathy, lumbar region: Secondary | ICD-10-CM | POA: Diagnosis not present

## 2022-03-21 DIAGNOSIS — Z820 Family history of epilepsy and other diseases of the nervous system: Secondary | ICD-10-CM | POA: Diagnosis not present

## 2022-03-21 DIAGNOSIS — M5127 Other intervertebral disc displacement, lumbosacral region: Secondary | ICD-10-CM | POA: Diagnosis not present

## 2022-03-21 DIAGNOSIS — G252 Other specified forms of tremor: Secondary | ICD-10-CM | POA: Diagnosis not present

## 2022-03-21 DIAGNOSIS — M6281 Muscle weakness (generalized): Secondary | ICD-10-CM | POA: Diagnosis not present

## 2022-03-21 DIAGNOSIS — Z6831 Body mass index (BMI) 31.0-31.9, adult: Secondary | ICD-10-CM | POA: Diagnosis not present

## 2022-03-27 DIAGNOSIS — M545 Low back pain, unspecified: Secondary | ICD-10-CM | POA: Diagnosis not present

## 2022-03-27 DIAGNOSIS — M5416 Radiculopathy, lumbar region: Secondary | ICD-10-CM | POA: Diagnosis not present

## 2022-03-27 DIAGNOSIS — M6281 Muscle weakness (generalized): Secondary | ICD-10-CM | POA: Diagnosis not present

## 2022-03-30 DIAGNOSIS — M6281 Muscle weakness (generalized): Secondary | ICD-10-CM | POA: Diagnosis not present

## 2022-03-30 DIAGNOSIS — M5416 Radiculopathy, lumbar region: Secondary | ICD-10-CM | POA: Diagnosis not present

## 2022-04-04 DIAGNOSIS — D2239 Melanocytic nevi of other parts of face: Secondary | ICD-10-CM | POA: Diagnosis not present

## 2022-04-04 DIAGNOSIS — L918 Other hypertrophic disorders of the skin: Secondary | ICD-10-CM | POA: Diagnosis not present

## 2022-04-17 DIAGNOSIS — Q357 Cleft uvula: Secondary | ICD-10-CM | POA: Diagnosis not present

## 2022-04-17 DIAGNOSIS — J343 Hypertrophy of nasal turbinates: Secondary | ICD-10-CM | POA: Diagnosis not present

## 2022-04-17 DIAGNOSIS — J342 Deviated nasal septum: Secondary | ICD-10-CM | POA: Diagnosis not present

## 2022-04-17 DIAGNOSIS — R0981 Nasal congestion: Secondary | ICD-10-CM | POA: Diagnosis not present

## 2022-05-02 DIAGNOSIS — Q385 Congenital malformations of palate, not elsewhere classified: Secondary | ICD-10-CM | POA: Diagnosis not present

## 2022-05-02 DIAGNOSIS — Z9981 Dependence on supplemental oxygen: Secondary | ICD-10-CM | POA: Diagnosis not present

## 2022-05-02 DIAGNOSIS — I10 Essential (primary) hypertension: Secondary | ICD-10-CM | POA: Diagnosis not present

## 2022-05-02 DIAGNOSIS — J342 Deviated nasal septum: Secondary | ICD-10-CM | POA: Diagnosis not present

## 2022-05-02 DIAGNOSIS — J343 Hypertrophy of nasal turbinates: Secondary | ICD-10-CM | POA: Diagnosis not present

## 2022-05-02 DIAGNOSIS — G4733 Obstructive sleep apnea (adult) (pediatric): Secondary | ICD-10-CM | POA: Diagnosis not present

## 2022-05-03 DIAGNOSIS — Z9889 Other specified postprocedural states: Secondary | ICD-10-CM | POA: Diagnosis not present

## 2022-05-11 DIAGNOSIS — Z9889 Other specified postprocedural states: Secondary | ICD-10-CM | POA: Diagnosis not present

## 2022-06-12 DIAGNOSIS — G252 Other specified forms of tremor: Secondary | ICD-10-CM | POA: Diagnosis not present

## 2022-06-12 DIAGNOSIS — I10 Essential (primary) hypertension: Secondary | ICD-10-CM | POA: Diagnosis not present

## 2022-06-12 DIAGNOSIS — Z820 Family history of epilepsy and other diseases of the nervous system: Secondary | ICD-10-CM | POA: Diagnosis not present

## 2022-06-12 DIAGNOSIS — E785 Hyperlipidemia, unspecified: Secondary | ICD-10-CM | POA: Diagnosis not present

## 2022-06-19 DIAGNOSIS — M5416 Radiculopathy, lumbar region: Secondary | ICD-10-CM | POA: Diagnosis not present

## 2022-06-19 DIAGNOSIS — M47816 Spondylosis without myelopathy or radiculopathy, lumbar region: Secondary | ICD-10-CM | POA: Diagnosis not present

## 2022-07-03 DIAGNOSIS — M5416 Radiculopathy, lumbar region: Secondary | ICD-10-CM | POA: Diagnosis not present

## 2022-07-17 DIAGNOSIS — M5416 Radiculopathy, lumbar region: Secondary | ICD-10-CM | POA: Diagnosis not present

## 2022-07-17 DIAGNOSIS — M47896 Other spondylosis, lumbar region: Secondary | ICD-10-CM | POA: Diagnosis not present

## 2022-07-18 ENCOUNTER — Other Ambulatory Visit: Payer: Self-pay | Admitting: Anesthesiology

## 2022-07-18 DIAGNOSIS — M5459 Other low back pain: Secondary | ICD-10-CM

## 2022-08-14 ENCOUNTER — Other Ambulatory Visit: Payer: BC Managed Care – PPO

## 2022-08-15 DIAGNOSIS — Z6831 Body mass index (BMI) 31.0-31.9, adult: Secondary | ICD-10-CM | POA: Diagnosis not present

## 2022-08-15 DIAGNOSIS — F429 Obsessive-compulsive disorder, unspecified: Secondary | ICD-10-CM | POA: Diagnosis not present

## 2022-09-25 DIAGNOSIS — F429 Obsessive-compulsive disorder, unspecified: Secondary | ICD-10-CM | POA: Diagnosis not present

## 2022-09-25 DIAGNOSIS — F988 Other specified behavioral and emotional disorders with onset usually occurring in childhood and adolescence: Secondary | ICD-10-CM | POA: Diagnosis not present

## 2022-09-25 DIAGNOSIS — Z1331 Encounter for screening for depression: Secondary | ICD-10-CM | POA: Diagnosis not present

## 2022-09-25 DIAGNOSIS — Z6830 Body mass index (BMI) 30.0-30.9, adult: Secondary | ICD-10-CM | POA: Diagnosis not present

## 2022-10-31 DIAGNOSIS — F988 Other specified behavioral and emotional disorders with onset usually occurring in childhood and adolescence: Secondary | ICD-10-CM | POA: Diagnosis not present

## 2022-10-31 DIAGNOSIS — F429 Obsessive-compulsive disorder, unspecified: Secondary | ICD-10-CM | POA: Diagnosis not present

## 2022-10-31 DIAGNOSIS — E785 Hyperlipidemia, unspecified: Secondary | ICD-10-CM | POA: Diagnosis not present

## 2022-10-31 DIAGNOSIS — Z6830 Body mass index (BMI) 30.0-30.9, adult: Secondary | ICD-10-CM | POA: Diagnosis not present

## 2022-10-31 DIAGNOSIS — I10 Essential (primary) hypertension: Secondary | ICD-10-CM | POA: Diagnosis not present

## 2023-01-15 ENCOUNTER — Ambulatory Visit: Payer: BC Managed Care – PPO | Attending: Cardiology | Admitting: Cardiology

## 2023-01-15 ENCOUNTER — Telehealth: Payer: Self-pay | Admitting: *Deleted

## 2023-01-15 DIAGNOSIS — I10 Essential (primary) hypertension: Secondary | ICD-10-CM | POA: Diagnosis not present

## 2023-01-15 DIAGNOSIS — R0683 Snoring: Secondary | ICD-10-CM

## 2023-01-15 DIAGNOSIS — G4733 Obstructive sleep apnea (adult) (pediatric): Secondary | ICD-10-CM | POA: Diagnosis not present

## 2023-01-15 NOTE — Patient Instructions (Addendum)
Medication Instructions:  Your physician recommends that you continue on your current medications as directed. Please refer to the Current Medication list given to you today.  *If you need a refill on your cardiac medications before your next appointment, please call your pharmacy*  Lab Work: If you have labs (blood work) drawn today and your tests are completely normal, you will receive your results only by: MyChart Message (if you have MyChart) OR A paper copy in the mail If you have any lab test that is abnormal or we need to change your treatment, we will call you to review the results.  Testing/Procedures: None ordered today.  Follow-Up: At [facility], you and your health needs are our priority.  As part of our continuing mission to provide you with exceptional heart care, we have created designated Provider Care Teams.  These Care Teams include your primary Cardiologist (physician) and Advanced Practice Providers (APPs -  Physician Assistants and Nurse Practitioners) who all work together to provide you with the care you need, when you need it.  We recommend signing up for the patient portal called "MyChart".  Sign up information is provided on this After Visit Summary.  MyChart is used to connect with patients for Virtual Visits (Telemedicine).  Patients are able to view lab/test results, encounter notes, upcoming appointments, etc.  Non-urgent messages can be sent to your provider as well.   To learn more about what you can do with MyChart, go to [email or web address].    Your next appointment:   8 week(s)  Provider:   Dr. Mayford Knife     Other Instructions You have been referred to ENT Dr. Marcheta Grammes.

## 2023-01-15 NOTE — Progress Notes (Signed)
Virtual Visit via Video  Note   This visit type was conducted due to national recommendations for restrictions regarding the COVID-19 Pandemic (e.g. social distancing) in an effort to limit this patient's exposure and mitigate transmission in our community.  Due to his co-morbid illnesses, this patient is at least at moderate risk for complications without adequate follow up.  This format is felt to be most appropriate for this patient at this time.  The patient did not have access to video technology/had technical difficulties with video requiring transit  Please refer to the patient's chart for his  consent to telehealth for [facility].   Evaluation Performed:  Follow-up visit  Date:  01/15/2023   ID:  Mark Mcclain, DOB 1965-11-07, MRN [id number]  Patient Location:  Home  Provider location:   [facility]  PCP:  Marlyn Corporal, PA  Cardiologist:  Belva Crome, MD Electrophysiologist:  None   Chief Complaint:  OSA  History of Present Illness:    Mark Mcclain is a 57 y.o. male who presents via audio/video conferencing for a telehealth visit today.    This is a 57yo male with a hx of HTN, snoring and witnessed apnea. He was referred by Dr. Tomie China for evaluation of excessive daytime sleepiness.  He underwent a home sleep study which showed severe OSA with an AHI of 37.8/hr with no central apneas and nocturnal hypoxemia with O2 sats as low as 82%.  He was placed on auto CPAP from 4 to 20cm H2O and his now here for followup.   He tells me that he wakes up in the middle of the night and  it irritates him and he has to take it off. Since he has nasal surgery the only mask he tolerates is the nasal mask and then irritates the bridge of his nose.  He tried an under the nose FFM that leaked a lot and he felt like it smothered him. He still feels tired when he gets up and easily falls asleep during the day.  He goes to bed around 10pm and falls asleep within 15 min and gets up around  5:30-6:30am. He wakes up around 3-4 am and usually goes back to sleep.  He has some mild mouth but no nasal dryness.  He does have problems with nasal congestion and spits up clear to yellow phlegm during the day.  He is not interested in the Lyden device at this time.   Prior sleep  studies:   The following studies were reviewed today:  Home sleep study, PAP compliance download  Past Medical History:  Diagnosis Date   BMI 30.0-30.9,adult    Hyperlipidemia    Hypertension    Screening for depression    Weakness    Past Surgical History:  Procedure Laterality Date   NO PAST SURGERIES       No outpatient medications have been marked as taking for the 01/15/23 encounter (Video Visit) with Quintella Reichert, MD.     Allergies:   Zithromax [azithromycin]   Social History   Tobacco Use   Smoking status: Never   Smokeless tobacco: Current    Types: Chew     Family Hx: The patient's family history is not on file.  ROS:   Please see the history of present illness.     All other systems reviewed and are negative.   Labs/Other Tests and Data Reviewed:    Recent Labs: No results found for requested labs within last 365 days.  Recent Lipid Panel Lab Results  Component Value Date/Time   CHOL 156 09/22/2019 08:19 AM   TRIG 256 (H) 09/22/2019 08:19 AM   HDL 28 (L) 09/22/2019 08:19 AM   CHOLHDL 5.6 (H) 09/22/2019 08:19 AM   LDLCALC 85 09/22/2019 08:19 AM    Wt Readings from Last 3 Encounters:  03/05/22 220 lb (99.8 kg)  08/30/21 223 lb (101.2 kg)  10/21/20 219 lb (99.3 kg)     Objective:    Vital Signs:  There were no vitals taken for this visit.  Well nourished, well developed male in no acute distress. Well appearing, alert and conversant, regular work of breathing,  good skin color  Eyes- anicteric mouth- oral mucosa is pink  neuro- grossly intact skin- no apparent rash or lesions or cyanosis  ASSESSMENT & PLAN:    1.  OSA - The PAP download performed by  his DME was personally reviewed and interpreted by me today and showed an AHI of 1/hr on auto CPAP from 4 to 20 cm H2O with 60% compliance in using more than 4 hours nightly.  The patient has been using and benefiting from PAP use and will continue to benefit from therapy.  -I encouraged him to be more compliant with his device -He does not tolerate the nasal mask well due to irritation over the bridge of his nose -I will order him a nasal cushion mask to see how that works -I will get him back into see his ENT due to his nasal congestion and drainage as well as bad breath that has occurred recently.    2.  HTN -BP is adequately controlled at home -Continue prescription drug management with HCTZ 25 mg daily, losartan 50 mg daily with as needed refills   Time:   Today, I have spent 15 minutes on telemedicine discussing medical problems including snoring and witnessed apnea and OSA and reviewing patient's chart including home sleep study and PAP compliance.  Medication Adjustments/Labs and Tests Ordered: Current medicines are reviewed at length with the patient today.  Concerns regarding medicines are outlined above.  Tests Ordered: No orders of the defined types were placed in this encounter.  Medication Changes: No orders of the defined types were placed in this encounter.   Disposition:  Follow up 8 weeks  Signed, Armanda Magic, MD  01/15/2023 1:29 PM    [facility]

## 2023-01-15 NOTE — Telephone Encounter (Signed)
[facility]ED [facility]

## 2023-01-15 NOTE — Telephone Encounter (Signed)
Per Dr Mendel Corning, please order a nasal cushion mask

## 2023-02-13 DIAGNOSIS — F419 Anxiety disorder, unspecified: Secondary | ICD-10-CM | POA: Diagnosis not present

## 2023-02-13 DIAGNOSIS — E785 Hyperlipidemia, unspecified: Secondary | ICD-10-CM | POA: Diagnosis not present

## 2023-02-13 DIAGNOSIS — F909 Attention-deficit hyperactivity disorder, unspecified type: Secondary | ICD-10-CM | POA: Diagnosis not present

## 2023-02-13 DIAGNOSIS — Z23 Encounter for immunization: Secondary | ICD-10-CM | POA: Diagnosis not present

## 2023-02-13 DIAGNOSIS — G4733 Obstructive sleep apnea (adult) (pediatric): Secondary | ICD-10-CM | POA: Diagnosis not present

## 2023-02-13 DIAGNOSIS — K219 Gastro-esophageal reflux disease without esophagitis: Secondary | ICD-10-CM | POA: Diagnosis not present

## 2023-02-13 DIAGNOSIS — M545 Low back pain, unspecified: Secondary | ICD-10-CM | POA: Diagnosis not present

## 2023-02-19 NOTE — Telephone Encounter (Signed)
Patient states he has received the new make. Calling for update. Please advise

## 2023-02-21 NOTE — Telephone Encounter (Signed)
Reached out to [facility] to see why patient never received his nasal mask and they say it was an oversight and they will get that out to him.

## 2023-03-05 DIAGNOSIS — R0683 Snoring: Secondary | ICD-10-CM | POA: Diagnosis not present

## 2023-03-05 DIAGNOSIS — G4733 Obstructive sleep apnea (adult) (pediatric): Secondary | ICD-10-CM | POA: Diagnosis not present

## 2023-03-12 ENCOUNTER — Ambulatory Visit: Payer: BC Managed Care – PPO | Admitting: Cardiology

## 2023-03-20 DIAGNOSIS — E559 Vitamin D deficiency, unspecified: Secondary | ICD-10-CM | POA: Diagnosis not present

## 2023-03-20 DIAGNOSIS — Z13228 Encounter for screening for other metabolic disorders: Secondary | ICD-10-CM | POA: Diagnosis not present

## 2023-03-20 DIAGNOSIS — F419 Anxiety disorder, unspecified: Secondary | ICD-10-CM | POA: Diagnosis not present

## 2023-03-20 DIAGNOSIS — Z125 Encounter for screening for malignant neoplasm of prostate: Secondary | ICD-10-CM | POA: Diagnosis not present

## 2023-03-20 DIAGNOSIS — Z131 Encounter for screening for diabetes mellitus: Secondary | ICD-10-CM | POA: Diagnosis not present

## 2023-03-20 DIAGNOSIS — E785 Hyperlipidemia, unspecified: Secondary | ICD-10-CM | POA: Diagnosis not present

## 2023-03-20 DIAGNOSIS — D519 Vitamin B12 deficiency anemia, unspecified: Secondary | ICD-10-CM | POA: Diagnosis not present

## 2023-03-20 DIAGNOSIS — F909 Attention-deficit hyperactivity disorder, unspecified type: Secondary | ICD-10-CM | POA: Diagnosis not present

## 2023-03-20 DIAGNOSIS — G4733 Obstructive sleep apnea (adult) (pediatric): Secondary | ICD-10-CM | POA: Diagnosis not present

## 2023-03-20 DIAGNOSIS — Z13220 Encounter for screening for lipoid disorders: Secondary | ICD-10-CM | POA: Diagnosis not present

## 2023-04-30 DIAGNOSIS — R5383 Other fatigue: Secondary | ICD-10-CM | POA: Diagnosis not present

## 2023-04-30 DIAGNOSIS — R1084 Generalized abdominal pain: Secondary | ICD-10-CM | POA: Diagnosis not present

## 2023-04-30 DIAGNOSIS — R1110 Vomiting, unspecified: Secondary | ICD-10-CM | POA: Diagnosis not present

## 2023-05-23 ENCOUNTER — Telehealth: Payer: Self-pay

## 2023-05-23 NOTE — Telephone Encounter (Signed)
Spoke with patient's wife. She will have patient call me tomorrow (05/24/23) with confirmation on receiving his new mask.

## 2023-05-23 NOTE — Telephone Encounter (Signed)
-----   Message from Nurse Alcario Drought E sent at 05/23/2023  3:03 PM EST ----- Regarding: nasal cushion mask Would anyone have a moment to see if patient ever got his nasal cushion mask? I believe Coralee North ordered it back in October. Alcario Drought ----- Message ----- From: Quintella Reichert, MD Sent: 03/06/2023   8:18 AM EST To: Reesa Chew, CMA; Luellen Pucker, RN  Please find out if patient ever got his nasal cushion mask ----- Message ----- From: Elliot Cousin, RMA Sent: 03/06/2023   6:56 AM EDT To: Quintella Reichert, MD; Luellen Pucker, RN

## 2023-06-24 ENCOUNTER — Telehealth: Payer: Self-pay

## 2023-06-24 NOTE — Telephone Encounter (Signed)
 Call to patient to see if he ever received his nasal cushion mask. Patient states he did receive it back in November 2024. He states he has not been using it much, especially since he just got over COVID about 3 weeks ago. Advised patient to increase compliance as Dr. Mayford Knife may want to get a download to see if this mask is working better for him. Patient verbalized understanding. Patient responses forwarded to Dr. Mayford Knife.

## 2023-06-24 NOTE — Telephone Encounter (Signed)
-----   Message from Nurse Brandie R sent at 05/23/2023  4:02 PM EST ----- Regarding: RE: nasal cushion mask I called him and his VM was not setup ----- Message ----- From: Luellen Pucker, RN Sent: 05/23/2023   3:03 PM EST To: Cv Div Sleep Studies Subject: nasal cushion mask                             Would anyone have a moment to see if patient ever got his nasal cushion mask? I believe Coralee North ordered it back in October. Alcario Drought ----- Message ----- From: Quintella Reichert, MD Sent: 03/06/2023   8:18 AM EST To: Reesa Chew, CMA; Luellen Pucker, RN  Please find out if patient ever got his nasal cushion mask ----- Message ----- From: Elliot Cousin, RMA Sent: 03/06/2023   6:56 AM EDT To: Quintella Reichert, MD; Luellen Pucker, RN
